# Patient Record
Sex: Female | Born: 1945
Health system: Southern US, Community
[De-identification: ages and names within clinical notes are randomized; demographics above are authoritative.]

## PROBLEM LIST (undated history)

## (undated) DIAGNOSIS — F32A Depression, unspecified: Secondary | ICD-10-CM

## (undated) DIAGNOSIS — F329 Major depressive disorder, single episode, unspecified: Secondary | ICD-10-CM

## (undated) DIAGNOSIS — B019 Varicella without complication: Secondary | ICD-10-CM

## (undated) DIAGNOSIS — T7840XA Allergy, unspecified, initial encounter: Secondary | ICD-10-CM

## (undated) DIAGNOSIS — I10 Essential (primary) hypertension: Secondary | ICD-10-CM

## (undated) DIAGNOSIS — Z780 Asymptomatic menopausal state: Secondary | ICD-10-CM

## (undated) DIAGNOSIS — U071 COVID-19: Secondary | ICD-10-CM

## (undated) DIAGNOSIS — D649 Anemia, unspecified: Secondary | ICD-10-CM

## (undated) HISTORY — DX: Depression, unspecified: F32.A

## (undated) HISTORY — DX: Essential (primary) hypertension: I10

## (undated) HISTORY — DX: Asymptomatic menopausal state: Z78.0

## (undated) HISTORY — DX: Anemia, unspecified: D64.9

## (undated) HISTORY — PX: SKIN BIOPSY: SHX1

## (undated) HISTORY — DX: Major depressive disorder, single episode, unspecified: F32.9

## (undated) HISTORY — DX: Varicella without complication: B01.9

## (undated) HISTORY — PX: TUBAL LIGATION: SHX77

## (undated) HISTORY — DX: COVID-19: U07.1

## (undated) HISTORY — DX: Allergy, unspecified, initial encounter: T78.40XA

---

## 2016-01-01 ENCOUNTER — Ambulatory Visit: Payer: Self-pay | Admitting: Family Medicine

## 2016-01-18 ENCOUNTER — Encounter: Payer: Self-pay | Admitting: Family Medicine

## 2016-01-18 DIAGNOSIS — I10 Essential (primary) hypertension: Secondary | ICD-10-CM | POA: Insufficient documentation

## 2016-01-18 DIAGNOSIS — F329 Major depressive disorder, single episode, unspecified: Secondary | ICD-10-CM | POA: Insufficient documentation

## 2016-01-18 DIAGNOSIS — F32A Depression, unspecified: Secondary | ICD-10-CM | POA: Insufficient documentation

## 2016-01-19 ENCOUNTER — Encounter: Payer: Self-pay | Admitting: Family Medicine

## 2016-01-19 ENCOUNTER — Ambulatory Visit (INDEPENDENT_AMBULATORY_CARE_PROVIDER_SITE_OTHER): Payer: PPO | Admitting: Family Medicine

## 2016-01-19 VITALS — BP 130/70 | HR 89 | Temp 97.8°F | Ht 61.75 in | Wt 205.0 lb

## 2016-01-19 DIAGNOSIS — I10 Essential (primary) hypertension: Secondary | ICD-10-CM | POA: Diagnosis not present

## 2016-01-19 DIAGNOSIS — Z23 Encounter for immunization: Secondary | ICD-10-CM | POA: Diagnosis not present

## 2016-01-19 DIAGNOSIS — Z7189 Other specified counseling: Secondary | ICD-10-CM

## 2016-01-19 DIAGNOSIS — Z0001 Encounter for general adult medical examination with abnormal findings: Secondary | ICD-10-CM | POA: Insufficient documentation

## 2016-01-19 DIAGNOSIS — Z1211 Encounter for screening for malignant neoplasm of colon: Secondary | ICD-10-CM

## 2016-01-19 DIAGNOSIS — Z7689 Persons encountering health services in other specified circumstances: Secondary | ICD-10-CM

## 2016-01-19 DIAGNOSIS — Z0000 Encounter for general adult medical examination without abnormal findings: Secondary | ICD-10-CM | POA: Diagnosis not present

## 2016-01-19 MED ORDER — LISINOPRIL 40 MG PO TABS: 40.0000 mg | ORAL_TABLET | Freq: Every day | ORAL | Status: DC

## 2016-01-19 MED ORDER — AMLODIPINE BESYLATE 10 MG PO TABS: 10.0000 mg | ORAL_TABLET | Freq: Every day | ORAL | Status: DC

## 2016-01-19 NOTE — Patient Instructions (Signed)
It was a pleasure meeting you today.  Please follow instructions for the cologuard.  You have received tdap and prevnar.   Medicare wellness exam in 3-6 mos.

## 2016-01-19 NOTE — Progress Notes (Signed)
Patient ID: Meghan Vang, female   DOB: [birth year], 70 y.o.   MRN: [id number]      Patient ID: Meghan Vang, female  DOB: Oct 12, 1946, 70 y.o.   MRN: [id number]  Subjective:  Meghan Vang is a 70 y.o. female present for establishment of care.  All past medical history, surgical history, allergies, family history, immunizations, medications and social history were obtained and entered in the electronic medical record today. All recent labs, ED visits and hospitalizations within the last year were reviewed.  Hypertension: She reports a history of hypertension, she has been on medications for approximately 2 years. She currently is compliant with her amlodipine 10 mg daily and her lisinopril 40 mg daily she does carry on a active lifestyle, eats a low-salt diet. She denies any chest pain, shortness of breath, dizziness or lower extremity edema. She is needing refills on her medications today.  Health maintenance:  Colonoscopy: Fhx in father. colonoscopy was completed in 2014 and was a three year follow-up per patient. She does not desire colonoscopy, but would Consider cologuard.  Mammogram: No Fhx. Last mammogram. 2015; needs scheduled at [facility] wellness. Cervical cancer screening: not indicated. Immunizations: Unknown.  Infectious disease screening: Not indicated. DEXA: 2015; osteopenia. Told to take calcium and vitamin D use.   Past Medical History  Diagnosis Date  . Depression   . Hypertension   . Menopause   . Allergy   . Anemia   . Chicken pox    Allergies  Allergen Reactions  . Bactrim [Sulfamethoxazole-Trimethoprim]    Past Surgical History  Procedure Laterality Date  . Tubal ligation     Family History  Problem Relation Age of Onset  . Colon cancer Father 2  . Diabetes Father   . Heart disease Father   . Asthma Mother   . Diabetes Mother   . Heart disease Mother   . Asthma Brother   . Heart disease Brother   . Liver cancer Maternal Grandmother   . Heart  disease Maternal Grandfather   . Heart disease Paternal Grandmother   . Stroke Paternal Grandfather    Social History   Social History  . Marital Status: Single    Spouse Name: N/A  . Number of Children: N/A  . Years of Education: N/A   Occupational History  . Not on file.   Social History Main Topics  . Smoking status: Never Smoker   . Smokeless tobacco: Never Used  . Alcohol Use: No  . Drug Use: No  . Sexual Activity: No   Other Topics Concern  . Not on file   Social History Narrative   Divorced. 2 sons, 1 daughter. [name], [name]).   Some college. Retired. Moved from [place] to be next to family.    Drinks caffeine.    Wears her seatbelt and bike helmet.    Smoke detector in the home.    Feels safe in her relationships.         ROS: Negative, with the exception of above mentioned in HPI  Objective: BP 130/70 mmHg  Pulse 89  Temp(Src) 97.8 F (36.6 C) (Oral)  Ht 5' 1.75" (1.568 m)  Wt 205 lb (92.987 kg)  BMI 37.82 kg/m2  SpO2 98% Gen: Afebrile. No acute distress. Nontoxic in appearance, well-developed, well-nourished, female, obese female.  HENT: AT. [place]. Bilateral TM visualized and normal in appearance, normal external auditory canal. MMM, no oral lesions. ilateral nares WNL. Throat WNL  Eyes:Pupils Equal Round Reactive to light, Extraocular movements intact,  Conjunctiva without redness, discharge or icterus. Neck/lymp/endocrine: Supple,No lymphadenopathy, No thyromegaly CV: RRR, No edema, +2/4 P posterior tibialis pulses.  Chest: CTAB, no wheeze, rhonchi or crackles.  Abd: Soft. NTND. BS present  Skin: No rashes, purpura or petechiae. Warm and well-perfused. Skin intact. Neuro/Msk: Normal gait. PERLA. EOMi. Alert. Oriented x3.   Psych: Normal affect, dress and demeanor. Normal speech. Normal thought content and judgment.   Assessment/plan: Meghan Vang is a 70 y.o. female present for establish care.  Colonoscopy: Fhx in father. colonoscopy was  completed in 2014 and was a three year follow-up per patient. She does not desire colonoscopy, but would Consider cologuard.  Mammogram: No Fhx. Last mammogram. 2015; needs scheduled at [facility] wellness. Cervical cancer screening: not indicated. Immunizations: Unknown. Tdap and prevnar administered today  Infectious disease screening: Not indicated. DEXA: 2015; osteopenia. Told to take calcium and vitamin D use.  - Cologuard information given to patient.--> colon cancer screening.  - Tdap vaccine greater than or equal to 7yo IM - Pneumococcal conjugate vaccine 13-valent  Essential hypertension, benign - Continue amlodipine and lisinopril, refills prescribed today. - Follow-up in 6 months. Low-salt diet encouraged. Routine exercise.   Return in about 6 months (around 07/21/2016), or MWV, subsequent, for Office visit. Greater than 45 minutes was spent with patient, greater than 50% of that time was spent face-to-face with patient counseling and coordinating care.  Electronically signed by: Howard Pouch, DO [place]

## 2016-04-14 DIAGNOSIS — Z1211 Encounter for screening for malignant neoplasm of colon: Secondary | ICD-10-CM | POA: Diagnosis not present

## 2016-04-14 DIAGNOSIS — Z1212 Encounter for screening for malignant neoplasm of rectum: Secondary | ICD-10-CM | POA: Diagnosis not present

## 2016-04-29 LAB — COLOGUARD: Cologuard: NEGATIVE

## 2016-07-20 ENCOUNTER — Encounter: Payer: PPO | Admitting: Family Medicine

## 2016-08-02 ENCOUNTER — Other Ambulatory Visit: Payer: Self-pay | Admitting: Family Medicine

## 2016-08-02 MED ORDER — AMLODIPINE BESYLATE 10 MG PO TABS: 10.0000 mg | ORAL_TABLET | Freq: Every day | ORAL | 0 refills | Status: DC

## 2016-08-02 MED ORDER — LISINOPRIL 40 MG PO TABS: 40.0000 mg | ORAL_TABLET | Freq: Every day | ORAL | 0 refills | Status: DC

## 2016-08-02 NOTE — Telephone Encounter (Signed)
Patient had to push her CPE back to 08/12/16 due to a family emergency.   Pt wanting to get an Rx for amlodipine and lisinopril just enough to get her to her appointment.  She will run out tomorrow.  Please call patient when filled.

## 2016-08-04 ENCOUNTER — Encounter: Payer: PPO | Admitting: Family Medicine

## 2016-08-12 ENCOUNTER — Encounter: Payer: PPO | Admitting: Family Medicine

## 2016-08-16 ENCOUNTER — Encounter: Payer: Self-pay | Admitting: Family Medicine

## 2016-08-16 ENCOUNTER — Ambulatory Visit (INDEPENDENT_AMBULATORY_CARE_PROVIDER_SITE_OTHER): Payer: PPO | Admitting: Family Medicine

## 2016-08-16 VITALS — BP 121/80 | HR 94 | Temp 98.3°F | Resp 20 | Ht 62.0 in | Wt 204.0 lb

## 2016-08-16 DIAGNOSIS — Z6837 Body mass index (BMI) 37.0-37.9, adult: Secondary | ICD-10-CM

## 2016-08-16 DIAGNOSIS — Z0000 Encounter for general adult medical examination without abnormal findings: Secondary | ICD-10-CM

## 2016-08-16 DIAGNOSIS — Z131 Encounter for screening for diabetes mellitus: Secondary | ICD-10-CM | POA: Diagnosis not present

## 2016-08-16 DIAGNOSIS — I10 Essential (primary) hypertension: Secondary | ICD-10-CM

## 2016-08-16 DIAGNOSIS — Z13220 Encounter for screening for lipoid disorders: Secondary | ICD-10-CM | POA: Diagnosis not present

## 2016-08-16 DIAGNOSIS — Z130 Encounter for screening for diseases of the blood and blood-forming organs and certain disorders involving the immune mechanism: Secondary | ICD-10-CM

## 2016-08-16 DIAGNOSIS — Z1329 Encounter for screening for other suspected endocrine disorder: Secondary | ICD-10-CM

## 2016-08-16 DIAGNOSIS — Z1159 Encounter for screening for other viral diseases: Secondary | ICD-10-CM

## 2016-08-16 LAB — COMPREHENSIVE METABOLIC PANEL
ALK PHOS: 107 U/L (ref 39–117)
ALT: 11 U/L (ref 0–35)
AST: 15 U/L (ref 0–37)
Albumin: 4.2 g/dL (ref 3.5–5.2)
BILIRUBIN TOTAL: 0.5 mg/dL (ref 0.2–1.2)
BUN: 11 mg/dL (ref 6–23)
CALCIUM: 9.2 mg/dL (ref 8.4–10.5)
CO2: 28 meq/L (ref 19–32)
Chloride: 103 mEq/L (ref 96–112)
Creatinine, Ser: 0.82 mg/dL (ref 0.40–1.20)
GFR: 73.18 mL/min (ref >60.00)
GLUCOSE: 104 mg/dL — AB (ref 70–99)
POTASSIUM: 4.4 meq/L (ref 3.5–5.1)
Sodium: 138 mEq/L (ref 135–145)
Total Protein: 7.4 g/dL (ref 6.0–8.3)

## 2016-08-16 LAB — CBC WITH DIFFERENTIAL/PLATELET
BASOS ABS: 0 10*3/uL (ref 0.0–0.1)
BASOS PCT: 0.5 % (ref 0.0–3.0)
EOS PCT: 1.2 % (ref 0.0–5.0)
Eosinophils Absolute: 0.1 10*3/uL (ref 0.0–0.7)
HEMATOCRIT: 41.7 % (ref 36.0–46.0)
Hemoglobin: 14.1 g/dL (ref 12.0–15.0)
LYMPHS ABS: 2.6 10*3/uL (ref 0.7–4.0)
LYMPHS PCT: 28.2 % (ref 12.0–46.0)
MCHC: 33.9 g/dL (ref 30.0–36.0)
MCV: 89 fl (ref 78.0–100.0)
MONOS PCT: 6 % (ref 3.0–12.0)
Monocytes Absolute: 0.5 10*3/uL (ref 0.1–1.0)
NEUTROS ABS: 5.9 10*3/uL (ref 1.4–7.7)
NEUTROS PCT: 64.1 % (ref 43.0–77.0)
PLATELETS: 291 10*3/uL (ref 150.0–400.0)
RBC: 4.69 Mil/uL (ref 3.87–5.11)
RDW: 14.4 % (ref 11.5–15.5)
WBC: 9.1 10*3/uL (ref 4.0–10.5)

## 2016-08-16 LAB — HEMOGLOBIN A1C: HEMOGLOBIN A1C: 5.8 % (ref 4.6–6.5)

## 2016-08-16 LAB — LIPID PANEL
CHOL/HDL RATIO: 3
CHOLESTEROL: 167 mg/dL (ref 0–200)
HDL: 59.7 mg/dL (ref >39.00)
LDL Cholesterol: 76 mg/dL (ref 0–99)
NonHDL: 106.93
TRIGLYCERIDES: 153 mg/dL — AB (ref 0.0–149.0)
VLDL: 30.6 mg/dL (ref 0.0–40.0)

## 2016-08-16 LAB — TSH: TSH: 1.58 u[IU]/mL (ref 0.35–4.50)

## 2016-08-16 MED ORDER — AMLODIPINE BESYLATE 10 MG PO TABS: 10.0000 mg | ORAL_TABLET | Freq: Every day | ORAL | 1 refills | Status: DC

## 2016-08-16 MED ORDER — ZOSTER VACCINE LIVE 19400 UNT/0.65ML ~~LOC~~ SUSR: 0.6500 mL | Freq: Once | SUBCUTANEOUS | 0 refills | Status: AC

## 2016-08-16 MED ORDER — LISINOPRIL 40 MG PO TABS: 40.0000 mg | ORAL_TABLET | Freq: Every day | ORAL | 1 refills | Status: DC

## 2016-08-16 NOTE — Progress Notes (Signed)
Patient ID: Meghan Vang, female   DOB: 09-11-1946, 70 y.o.   MRN: [id number]      Patient ID: Meghan Vang, female  DOB: 1946/11/08, 70 y.o.   MRN: [id number]  Subjective:  Meghan Vang is a 70 y.o. female present for CPE All past medical history, surgical history, allergies, family history, immunizations, medications and social history were obtained and entered in the electronic medical record today. All recent labs, ED visits and hospitalizations within the last year were reviewed. Patient Care Team    Relationship Specialty Notifications Start End  Ma Hillock, DO PCP - General Family Medicine  01/19/16    Pt has no complaints today.   Health maintenance:  Colonoscopy: Fhx in father. colonoscopy was completed in 2014 and was a three year follow-up per patient. She does not desire colonoscopy. Cologuard completed  04/14/2016: negative.  Mammogram: No Fhx. Last mammogram. 2015, birads 1; declines mammogram today Cervical cancer screening: not indicated. Immunizations: declines flu shot today. tdap 01/19/2016. PNA 13 01/2016. Zostavax offered and printed today. Infectious disease screening: HEP C indicated and pt agreeable to screening.  DEXA: 2015; osteopenia. Told to take calcium and vitamin D (1000u)   Depression screen [facility] 2/9 08/16/2016  Decreased Interest 0  Down, Depressed, Hopeless 0  PHQ - 2 Score 0   Current Exercise Habits: The patient does not participate in regular exercise at present Exercise limited by: Other - see comments (bunions)   Past Medical History:  Diagnosis Date  . Allergy   . Anemia   . Chicken pox   . Depression   . Hypertension   . Menopause    Allergies  Allergen Reactions  . Bactrim [Sulfamethoxazole-Trimethoprim]    Past Surgical History:  Procedure Laterality Date  . TUBAL LIGATION     Family History  Problem Relation Age of Onset  . Colon cancer Father 24  . Diabetes Father   . Heart disease Father   . Asthma Mother   . Diabetes  Mother   . Heart disease Mother   . Asthma Brother   . Heart disease Brother   . Liver cancer Maternal Grandmother   . Heart disease Maternal Grandfather   . Heart disease Paternal Grandmother   . Stroke Paternal Grandfather    Social History   Social History  . Marital status: Single    Spouse name: N/A  . Number of children: N/A  . Years of education: N/A   Occupational History  . Not on file.   Social History Main Topics  . Smoking status: Never Smoker  . Smokeless tobacco: Never Used  . Alcohol use No  . Drug use: No  . Sexual activity: No   Other Topics Concern  . Not on file   Social History Narrative   Divorced. 2 sons, 1 daughter. Meghan Vang, Meghan Vang).   Some college. Retired. Moved from [place] to be next to family.    Drinks caffeine.    Wears her seatbelt and bike helmet.    Smoke detector in the home.    Feels safe in her relationships.         ROS: Negative, with the exception of above mentioned in HPI  Objective: BP 121/80 (BP Location: Right Arm, Patient Position: Sitting, Cuff Size: Large)   Pulse 94   Temp 98.3 F (36.8 C)   Resp 20   Ht 5' 2"  (1.575 m)   Wt 204 lb (92.5 kg)   SpO2 97%   BMI 37.31 kg/m  Gen: Afebrile. No acute distress. Nontoxic in appearance, well-developed, well-nourished, female, obese. Very pleasant.  HENT: AT. [place]. Bilateral TM visualized and normal in appearance, normal external auditory canal with the exception of very mild dryness distal posterior canal. MMM, no oral lesions. bilateral nares WNL. Throat WNL. No cough or hoarseness on exam.  Eyes:Pupils Equal Round Reactive to light, Extraocular movements intact,  Conjunctiva without redness, discharge or icterus. Neck/lymp/endocrine: Supple,No lymphadenopathy, No thyromegaly CV: RRR, no murmur, No edema, +2/4 P posterior tibialis pulses.  Chest: CTAB, no wheeze, rhonchi or crackles.  Abd: Soft. NTND. BS present  Skin: No rashes, purpura or petechiae. Warm and  well-perfused. Skin intact. Neuro/Msk: Normal gait. PERLA. EOMi. Alert. Oriented x3. MS 5/5 UE/LE.  Psych: Normal affect, dress and demeanor. Normal speech. Normal thought content and judgment.  Assessment/plan: Meghan Vang is a 70 y.o. female present for CPE. Encounter for preventive health examination Pt encouraged to exercise > 150 minutes a week. Weight bearing exercise good for her bone health.  AVS provided on health maintenance.  Colonoscopy: Fhx in father. colonoscopy was completed in 2014 and was a three year follow-up per patient. She does not desire colonoscopy. Cologuard completed  04/14/2016: negative.  Mammogram: No Fhx. Last mammogram. 2015, birads 1; declines mammogram today Cervical cancer screening: not indicated. Immunizations: declines flu shot today. tdap 01/19/2016. PNA 13 01/2016. Zostavax offered and printed today. Infectious disease screening: HEP C indicated and pt agreeable to screening.  DEXA: 2015; osteopenia. Told to take calcium and vitamin D (1000u)  Screening for deficiency anemia - CBC w/Diff Thyroid disorder screen - TSH Diabetes mellitus screening - HgB A1c Screening cholesterol level - Lipid panel BMI 37.0-37.9, adult - TSH - HgB A1c - Lipid panel  Need for hepatitis C screening test - Hepatitis C Antibody Essential hypertension, benign - stable. Refills provided for 6 months - CBC w/Diff - lisinopril (PRINIVIL,ZESTRIL) 40 MG tablet; Take 1 tablet (40 mg total) by mouth daily.  Dispense: 90 tablet; Refill: 1 - amLODipine (NORVASC) 10 MG tablet; Take 1 tablet (10 mg total) by mouth daily.  Dispense: 90 tablet; Refill: 1 - Comp Met (CMET) - f/u  6 months.    Return in about 6 months (around 02/14/2017), or chronic medical conditions, for Chronic medical conditions. .   Electronically signed by: Howard Pouch, DO [place]

## 2016-08-16 NOTE — Patient Instructions (Signed)
Try to take about 1000u of vit D a day with food.   Health Maintenance, Female Adopting a healthy lifestyle and getting preventive care can go a long way to promote health and wellness. Talk with your health care provider about what schedule of regular examinations is right for you. This is a good chance for you to check in with your provider about disease prevention and staying healthy. In between checkups, there are plenty of things you can do on your own. Experts have done a lot of research about which lifestyle changes and preventive measures are most likely to keep you healthy. Ask your health care provider for more information. WEIGHT AND DIET  Eat a healthy diet  Be sure to include plenty of vegetables, fruits, low-fat dairy products, and lean protein.  Do not eat a lot of foods high in solid fats, added sugars, or salt.  Get regular exercise. This is one of the most important things you can do for your health.  Most adults should exercise for at least 150 minutes each week. The exercise should increase your heart rate and make you sweat (moderate-intensity exercise).  Most adults should also do strengthening exercises at least twice a week. This is in addition to the moderate-intensity exercise.  Maintain a healthy weight  Body mass index (BMI) is a measurement that can be used to identify possible weight problems. It estimates body fat based on height and weight. Your health care provider can help determine your BMI and help you achieve or maintain a healthy weight.  For females 46 years of age and older:   A BMI below 18.5 is considered underweight.  A BMI of 18.5 to 24.9 is normal.  A BMI of 25 to 29.9 is considered overweight.  A BMI of 30 and above is considered obese.  Watch levels of cholesterol and blood lipids  You should start having your blood tested for lipids and cholesterol at 70 years of age, then have this test every 5 years.  You may need to have your  cholesterol levels checked more often if:  Your lipid or cholesterol levels are high.  You are older than 70 years of age.  You are at high risk for heart disease.  CANCER SCREENING   Lung Cancer  Lung cancer screening is recommended for adults 58-57 years old who are at high risk for lung cancer because of a history of smoking.  A yearly low-dose CT scan of the lungs is recommended for people who:  Currently smoke.  Have quit within the past 15 years.  Have at least a 30-pack-year history of smoking. A pack year is smoking an average of one pack of cigarettes a day for 1 year.  Yearly screening should continue until it has been 15 years since you quit.  Yearly screening should stop if you develop a health problem that would prevent you from having lung cancer treatment.  Breast Cancer  Practice breast self-awareness. This means understanding how your breasts normally appear and feel.  It also means doing regular breast self-exams. Let your health care provider know about any changes, no matter how small.  If you are in your 20s or 30s, you should have a clinical breast exam (CBE) by a health care provider every 1-3 years as part of a regular health exam.  If you are 73 or older, have a CBE every year. Also consider having a breast X-ray (mammogram) every year.  If you have a family history of  breast cancer, talk to your health care provider about genetic screening.  If you are at high risk for breast cancer, talk to your health care provider about having an MRI and a mammogram every year.  Breast cancer gene (BRCA) assessment is recommended for women who have family members with BRCA-related cancers. BRCA-related cancers include:  Breast.  Ovarian.  Tubal.  Peritoneal cancers.  Results of the assessment will determine the need for genetic counseling and BRCA1 and BRCA2 testing. Cervical Cancer Your health care provider may recommend that you be screened regularly  for cancer of the pelvic organs (ovaries, uterus, and vagina). This screening involves a pelvic examination, including checking for microscopic changes to the surface of your cervix (Pap test). You may be encouraged to have this screening done every 3 years, beginning at age 34.  For women ages 72-65, health care providers may recommend pelvic exams and Pap testing every 3 years, or they may recommend the Pap and pelvic exam, combined with testing for human papilloma virus (HPV), every 5 years. Some types of HPV increase your risk of cervical cancer. Testing for HPV may also be done on women of any age with unclear Pap test results.  Other health care providers may not recommend any screening for nonpregnant women who are considered low risk for pelvic cancer and who do not have symptoms. Ask your health care provider if a screening pelvic exam is right for you.  If you have had past treatment for cervical cancer or a condition that could lead to cancer, you need Pap tests and screening for cancer for at least 20 years after your treatment. If Pap tests have been discontinued, your risk factors (such as having a new sexual partner) need to be reassessed to determine if screening should resume. Some women have medical problems that increase the chance of getting cervical cancer. In these cases, your health care provider may recommend more frequent screening and Pap tests. Colorectal Cancer  This type of cancer can be detected and often prevented.  Routine colorectal cancer screening usually begins at 70 years of age and continues through 70 years of age.  Your health care provider may recommend screening at an earlier age if you have risk factors for colon cancer.  Your health care provider may also recommend using home test kits to check for hidden blood in the stool.  A small camera at the end of a tube can be used to examine your colon directly (sigmoidoscopy or colonoscopy). This is done to check  for the earliest forms of colorectal cancer.  Routine screening usually begins at age 15.  Direct examination of the colon should be repeated every 5-10 years through 70 years of age. However, you may need to be screened more often if early forms of precancerous polyps or small growths are found. Skin Cancer  Check your skin from head to toe regularly.  Tell your health care provider about any new moles or changes in moles, especially if there is a change in a mole's shape or color.  Also tell your health care provider if you have a mole that is larger than the size of a pencil eraser.  Always use sunscreen. Apply sunscreen liberally and repeatedly throughout the day.  Protect yourself by wearing long sleeves, pants, a wide-brimmed hat, and sunglasses whenever you are outside. HEART DISEASE, DIABETES, AND HIGH BLOOD PRESSURE   High blood pressure causes heart disease and increases the risk of stroke. High blood pressure is more  likely to develop in:  People who have blood pressure in the high end of the normal range (130-139/85-89 mm Hg).  People who are overweight or obese.  People who are African American.  If you are 29-53 years of age, have your blood pressure checked every 3-5 years. If you are 33 years of age or older, have your blood pressure checked every year. You should have your blood pressure measured twice--once when you are at a hospital or clinic, and once when you are not at a hospital or clinic. Record the average of the two measurements. To check your blood pressure when you are not at a hospital or clinic, you can use:  An automated blood pressure machine at a pharmacy.  A home blood pressure monitor.  If you are between 12 years and 1 years old, ask your health care provider if you should take aspirin to prevent strokes.  Have regular diabetes screenings. This involves taking a blood sample to check your fasting blood sugar level.  If you are at a normal  weight and have a low risk for diabetes, have this test once every three years after 70 years of age.  If you are overweight and have a high risk for diabetes, consider being tested at a younger age or more often. PREVENTING INFECTION  Hepatitis B  If you have a higher risk for hepatitis B, you should be screened for this virus. You are considered at high risk for hepatitis B if:  You were born in a country where hepatitis B is common. Ask your health care provider which countries are considered high risk.  Your parents were born in a high-risk country, and you have not been immunized against hepatitis B (hepatitis B vaccine).  You have HIV or AIDS.  You use needles to inject street drugs.  You live with someone who has hepatitis B.  You have had sex with someone who has hepatitis B.  You get hemodialysis treatment.  You take certain medicines for conditions, including cancer, organ transplantation, and autoimmune conditions. Hepatitis C  Blood testing is recommended for:  Everyone born from [birth year] through 1965.  Anyone with known risk factors for hepatitis C. Sexually transmitted infections (STIs)  You should be screened for sexually transmitted infections (STIs) including gonorrhea and chlamydia if:  You are sexually active and are younger than 70 years of age.  You are older than 70 years of age and your health care provider tells you that you are at risk for this type of infection.  Your sexual activity has changed since you were last screened and you are at an increased risk for chlamydia or gonorrhea. Ask your health care provider if you are at risk.  If you do not have HIV, but are at risk, it may be recommended that you take a prescription medicine daily to prevent HIV infection. This is called pre-exposure prophylaxis (PrEP). You are considered at risk if:  You are sexually active and do not regularly use condoms or know the HIV status of your partner(s).  You take  drugs by injection.  You are sexually active with a partner who has HIV. Talk with your health care provider about whether you are at high risk of being infected with HIV. If you choose to begin PrEP, you should first be tested for HIV. You should then be tested every 3 months for as long as you are taking PrEP.  PREGNANCY   If you are premenopausal and you may  become pregnant, ask your health care provider about preconception counseling.  If you may become pregnant, take 400 to 800 micrograms (mcg) of folic acid every day.  If you want to prevent pregnancy, talk to your health care provider about birth control (contraception). OSTEOPOROSIS AND MENOPAUSE   Osteoporosis is a disease in which the bones lose minerals and strength with aging. This can result in serious bone fractures. Your risk for osteoporosis can be identified using a bone density scan.  If you are 3 years of age or older, or if you are at risk for osteoporosis and fractures, ask your health care provider if you should be screened.  Ask your health care provider whether you should take a calcium or vitamin D supplement to lower your risk for osteoporosis.  Menopause may have certain physical symptoms and risks.  Hormone replacement therapy may reduce some of these symptoms and risks. Talk to your health care provider about whether hormone replacement therapy is right for you.  HOME CARE INSTRUCTIONS   Schedule regular health, dental, and eye exams.  Stay current with your immunizations.   Do not use any tobacco products including cigarettes, chewing tobacco, or electronic cigarettes.  If you are pregnant, do not drink alcohol.  If you are breastfeeding, limit how much and how often you drink alcohol.  Limit alcohol intake to no more than 1 drink per day for nonpregnant women. One drink equals 12 ounces of beer, 5 ounces of wine, or 1 ounces of hard liquor.  Do not use street drugs.  Do not share  needles.  Ask your health care provider for help if you need support or information about quitting drugs.  Tell your health care provider if you often feel depressed.  Tell your health care provider if you have ever been abused or do not feel safe at home.   This information is not intended to replace advice given to you by your health care provider. Make sure you discuss any questions you have with your health care provider.   Document Released: 05/16/2011 Document Revised: 11/21/2014 Document Reviewed: 10/02/2013 Elsevier Interactive Patient Education [employer].

## 2016-08-17 ENCOUNTER — Telehealth: Payer: Self-pay | Admitting: Family Medicine

## 2016-08-17 DIAGNOSIS — R7303 Prediabetes: Secondary | ICD-10-CM | POA: Insufficient documentation

## 2016-08-17 LAB — HEPATITIS C ANTIBODY: HCV Ab: NEGATIVE

## 2016-08-17 NOTE — Telephone Encounter (Signed)
Left detailed message with lab results and instructions on patient voice mail per DPR. 

## 2016-08-17 NOTE — Telephone Encounter (Signed)
Please call pt; - her labs all look good, with the exception of very mild elevation in triglycerides (part of cholesterol panel) and elevated a1c, which puts her in the "prediabetic range".  - To help lower triglycerides and a1c: increase exercise if able to > 150 minutes a week. Cut back and sugar/carbs, eat more fish like albacore tuna and salmon, which are high in omega-3s (and/or add fish oil to regimen), a fat that's good for you. - F/U in 6 months on chronic medical conditions and we will also repeat a1c at that time.

## 2016-11-15 ENCOUNTER — Other Ambulatory Visit: Payer: Self-pay | Admitting: Family Medicine

## 2016-11-15 DIAGNOSIS — I10 Essential (primary) hypertension: Secondary | ICD-10-CM

## 2016-12-27 ENCOUNTER — Telehealth: Payer: Self-pay | Admitting: Family Medicine

## 2016-12-27 NOTE — Telephone Encounter (Signed)
Called patient to schedule awv. Lvm for patient to call office to schedule appt.  °

## 2017-02-06 ENCOUNTER — Ambulatory Visit: Payer: PPO | Admitting: Family Medicine

## 2017-02-13 ENCOUNTER — Encounter: Payer: Self-pay | Admitting: *Deleted

## 2017-02-13 ENCOUNTER — Telehealth: Payer: Self-pay | Admitting: Family Medicine

## 2017-02-13 DIAGNOSIS — I10 Essential (primary) hypertension: Secondary | ICD-10-CM

## 2017-02-13 MED ORDER — LISINOPRIL 40 MG PO TABS: ORAL_TABLET | ORAL | 0 refills | Status: DC

## 2017-02-13 MED ORDER — AMLODIPINE BESYLATE 10 MG PO TABS: ORAL_TABLET | ORAL | 0 refills | Status: DC

## 2017-02-13 NOTE — Telephone Encounter (Signed)
**  Remind patient they can make refill requests via MyChart**  Medication refill request (Name & Dosage):  amLODipine (NORVASC) 10 MG tablet lisinopril (PRINIVIL,ZESTRIL) 40 MG tablet  Preferred pharmacy (Name & Address):  [facility], [place]   Other comments (if applicable):   Patient nees it by 02/27/2023. Had a death in family and going out of town.

## 2017-02-15 ENCOUNTER — Ambulatory Visit: Payer: PPO | Admitting: Family Medicine

## 2017-03-03 ENCOUNTER — Telehealth: Payer: Self-pay

## 2017-03-03 NOTE — Telephone Encounter (Signed)
LM requesting CB regarding AWV. Requesting pt to complete AWV with Health Coach on 03/17/2017 @ 08:30 after appointment with PCP.

## 2017-03-17 ENCOUNTER — Encounter: Payer: Self-pay | Admitting: Family Medicine

## 2017-03-17 ENCOUNTER — Ambulatory Visit (INDEPENDENT_AMBULATORY_CARE_PROVIDER_SITE_OTHER): Payer: PPO | Admitting: Family Medicine

## 2017-03-17 VITALS — BP 127/79 | HR 71 | Temp 97.9°F | Resp 20 | Ht 62.0 in | Wt 203.0 lb

## 2017-03-17 DIAGNOSIS — E669 Obesity, unspecified: Secondary | ICD-10-CM | POA: Insufficient documentation

## 2017-03-17 DIAGNOSIS — Z6837 Body mass index (BMI) 37.0-37.9, adult: Secondary | ICD-10-CM

## 2017-03-17 DIAGNOSIS — Z0000 Encounter for general adult medical examination without abnormal findings: Secondary | ICD-10-CM

## 2017-03-17 DIAGNOSIS — I10 Essential (primary) hypertension: Secondary | ICD-10-CM

## 2017-03-17 DIAGNOSIS — E2839 Other primary ovarian failure: Secondary | ICD-10-CM | POA: Diagnosis not present

## 2017-03-17 DIAGNOSIS — R7303 Prediabetes: Secondary | ICD-10-CM

## 2017-03-17 LAB — POCT GLYCOSYLATED HEMOGLOBIN (HGB A1C): Hemoglobin A1C: 5.4

## 2017-03-17 MED ORDER — AMLODIPINE BESYLATE 10 MG PO TABS: ORAL_TABLET | ORAL | 1 refills | Status: DC

## 2017-03-17 MED ORDER — LISINOPRIL 40 MG PO TABS: ORAL_TABLET | ORAL | 1 refills | Status: DC

## 2017-03-17 NOTE — Patient Instructions (Addendum)
Your BP looks great and her a1c id now normal! [place] job! Try the PREP program to help kick start your exercise and diet regimen.  Low sodium diet.  See you in 6 months for your Physical.   Bring a copy of your advance directives to your next office visit.  Continue doing brain stimulating activities (puzzles, reading, adult coloring books, staying active) to keep memory sharp.   Protein Shakes: Syntha 6 Edge or Muscle Milk Light  Schedule bone scan.   Check with insurance/pharmacy for Shingrix coverage.     Health Maintenance, Female Adopting a healthy lifestyle and getting preventive care can go a long way to promote health and wellness. Talk with your health care provider about what schedule of regular examinations is right for you. This is a good chance for you to check in with your provider about disease prevention and staying healthy. In between checkups, there are plenty of things you can do on your own. Experts have done a lot of research about which lifestyle changes and preventive measures are most likely to keep you healthy. Ask your health care provider for more information. Weight and diet Eat a healthy diet  Be sure to include plenty of vegetables, fruits, low-fat dairy products, and lean protein.  Do not eat a lot of foods high in solid fats, added sugars, or salt.  Get regular exercise. This is one of the most important things you can do for your health.  Most adults should exercise for at least 150 minutes each week. The exercise should increase your heart rate and make you sweat (moderate-intensity exercise).  Most adults should also do strengthening exercises at least twice a week. This is in addition to the moderate-intensity exercise. Maintain a healthy weight  Body mass index (BMI) is a measurement that can be used to identify possible weight problems. It estimates body fat based on height and weight. Your health care provider can help determine your BMI and  help you achieve or maintain a healthy weight.  For females 69 years of age and older:  A BMI below 18.5 is considered underweight.  A BMI of 18.5 to 24.9 is normal.  A BMI of 25 to 29.9 is considered overweight.  A BMI of 30 and above is considered obese. Watch levels of cholesterol and blood lipids  You should start having your blood tested for lipids and cholesterol at 71 years of age, then have this test every 5 years.  You may need to have your cholesterol levels checked more often if:  Your lipid or cholesterol levels are high.  You are older than 71 years of age.  You are at high risk for heart disease. Cancer screening Lung Cancer  Lung cancer screening is recommended for adults 62-80 years old who are at high risk for lung cancer because of a history of smoking.  A yearly low-dose CT scan of the lungs is recommended for people who:  Currently smoke.  Have quit within the past 15 years.  Have at least a 30-pack-year history of smoking. A pack year is smoking an average of one pack of cigarettes a day for 1 year.  Yearly screening should continue until it has been 15 years since you quit.  Yearly screening should stop if you develop a health problem that would prevent you from having lung cancer treatment. Breast Cancer  Practice breast self-awareness. This means understanding how your breasts normally appear and feel.  It also means doing regular breast self-exams.  Let your health care provider know about any changes, no matter how small.  If you are in your 20s or 30s, you should have a clinical breast exam (CBE) by a health care provider every 1-3 years as part of a regular health exam.  If you are 99 or older, have a CBE every year. Also consider having a breast X-ray (mammogram) every year.  If you have a family history of breast cancer, talk to your health care provider about genetic screening.  If you are at high risk for breast cancer, talk to your  health care provider about having an MRI and a mammogram every year.  Breast cancer gene (BRCA) assessment is recommended for women who have family members with BRCA-related cancers. BRCA-related cancers include:  Breast.  Ovarian.  Tubal.  Peritoneal cancers.  Results of the assessment will determine the need for genetic counseling and BRCA1 and BRCA2 testing. Cervical Cancer  Your health care provider may recommend that you be screened regularly for cancer of the pelvic organs (ovaries, uterus, and vagina). This screening involves a pelvic examination, including checking for microscopic changes to the surface of your cervix (Pap test). You may be encouraged to have this screening done every 3 years, beginning at age 69.  For women ages 67-65, health care providers may recommend pelvic exams and Pap testing every 3 years, or they may recommend the Pap and pelvic exam, combined with testing for human papilloma virus (HPV), every 5 years. Some types of HPV increase your risk of cervical cancer. Testing for HPV may also be done on women of any age with unclear Pap test results.  Other health care providers may not recommend any screening for nonpregnant women who are considered low risk for pelvic cancer and who do not have symptoms. Ask your health care provider if a screening pelvic exam is right for you.  If you have had past treatment for cervical cancer or a condition that could lead to cancer, you need Pap tests and screening for cancer for at least 20 years after your treatment. If Pap tests have been discontinued, your risk factors (such as having a new sexual partner) need to be reassessed to determine if screening should resume. Some women have medical problems that increase the chance of getting cervical cancer. In these cases, your health care provider may recommend more frequent screening and Pap tests. Colorectal Cancer  This type of cancer can be detected and often  prevented.  Routine colorectal cancer screening usually begins at 71 years of age and continues through 71 years of age.  Your health care provider may recommend screening at an earlier age if you have risk factors for colon cancer.  Your health care provider may also recommend using home test kits to check for hidden blood in the stool.  A small camera at the end of a tube can be used to examine your colon directly (sigmoidoscopy or colonoscopy). This is done to check for the earliest forms of colorectal cancer.  Routine screening usually begins at age 80.  Direct examination of the colon should be repeated every 5-10 years through 71 years of age. However, you may need to be screened more often if early forms of precancerous polyps or small growths are found. Skin Cancer  Check your skin from head to toe regularly.  Tell your health care provider about any new moles or changes in moles, especially if there is a change in a mole's shape or color.  Also  tell your health care provider if you have a mole that is larger than the size of a pencil eraser.  Always use sunscreen. Apply sunscreen liberally and repeatedly throughout the day.  Protect yourself by wearing long sleeves, pants, a wide-brimmed hat, and sunglasses whenever you are outside. Heart disease, diabetes, and high blood pressure  High blood pressure causes heart disease and increases the risk of stroke. High blood pressure is more likely to develop in:  People who have blood pressure in the high end of the normal range (130-139/85-89 mm Hg).  People who are overweight or obese.  People who are African American.  If you are 43-53 years of age, have your blood pressure checked every 3-5 years. If you are 49 years of age or older, have your blood pressure checked every year. You should have your blood pressure measured twice-once when you are at a hospital or clinic, and once when you are not at a hospital or clinic. Record  the average of the two measurements. To check your blood pressure when you are not at a hospital or clinic, you can use:  An automated blood pressure machine at a pharmacy.  A home blood pressure monitor.  If you are between 14 years and 61 years old, ask your health care provider if you should take aspirin to prevent strokes.  Have regular diabetes screenings. This involves taking a blood sample to check your fasting blood sugar level.  If you are at a normal weight and have a low risk for diabetes, have this test once every three years after 71 years of age.  If you are overweight and have a high risk for diabetes, consider being tested at a younger age or more often. Preventing infection Hepatitis B  If you have a higher risk for hepatitis B, you should be screened for this virus. You are considered at high risk for hepatitis B if:  You were born in a country where hepatitis B is common. Ask your health care provider which countries are considered high risk.  Your parents were born in a high-risk country, and you have not been immunized against hepatitis B (hepatitis B vaccine).  You have HIV or AIDS.  You use needles to inject street drugs.  You live with someone who has hepatitis B.  You have had sex with someone who has hepatitis B.  You get hemodialysis treatment.  You take certain medicines for conditions, including cancer, organ transplantation, and autoimmune conditions. Hepatitis C  Blood testing is recommended for:  Everyone born from [birth year] through 1965.  Anyone with known risk factors for hepatitis C. Sexually transmitted infections (STIs)  You should be screened for sexually transmitted infections (STIs) including gonorrhea and chlamydia if:  You are sexually active and are younger than 71 years of age.  You are older than 71 years of age and your health care provider tells you that you are at risk for this type of infection.  Your sexual activity has  changed since you were last screened and you are at an increased risk for chlamydia or gonorrhea. Ask your health care provider if you are at risk.  If you do not have HIV, but are at risk, it may be recommended that you take a prescription medicine daily to prevent HIV infection. This is called pre-exposure prophylaxis (PrEP). You are considered at risk if:  You are sexually active and do not regularly use condoms or know the HIV status of your partner(s).  You  take drugs by injection.  You are sexually active with a partner who has HIV. Talk with your health care provider about whether you are at high risk of being infected with HIV. If you choose to begin PrEP, you should first be tested for HIV. You should then be tested every 3 months for as long as you are taking PrEP. Pregnancy  If you are premenopausal and you may become pregnant, ask your health care provider about preconception counseling.  If you may become pregnant, take 400 to 800 micrograms (mcg) of folic acid every day.  If you want to prevent pregnancy, talk to your health care provider about birth control (contraception). Osteoporosis and menopause  Osteoporosis is a disease in which the bones lose minerals and strength with aging. This can result in serious bone fractures. Your risk for osteoporosis can be identified using a bone density scan.  If you are 27 years of age or older, or if you are at risk for osteoporosis and fractures, ask your health care provider if you should be screened.  Ask your health care provider whether you should take a calcium or vitamin D supplement to lower your risk for osteoporosis.  Menopause may have certain physical symptoms and risks.  Hormone replacement therapy may reduce some of these symptoms and risks. Talk to your health care provider about whether hormone replacement therapy is right for you. Follow these instructions at home:  Schedule regular health, dental, and eye  exams.  Stay current with your immunizations.  Do not use any tobacco products including cigarettes, chewing tobacco, or electronic cigarettes.  If you are pregnant, do not drink alcohol.  If you are breastfeeding, limit how much and how often you drink alcohol.  Limit alcohol intake to no more than 1 drink per day for nonpregnant women. One drink equals 12 ounces of beer, 5 ounces of wine, or 1 ounces of hard liquor.  Do not use street drugs.  Do not share needles.  Ask your health care provider for help if you need support or information about quitting drugs.  Tell your health care provider if you often feel depressed.  Tell your health care provider if you have ever been abused or do not feel safe at home. This information is not intended to replace advice given to you by your health care provider. Make sure you discuss any questions you have with your health care provider. Document Released: 05/16/2011 Document Revised: 04/07/2016 Document Reviewed: 08/04/2015 Elsevier Interactive Patient Education  2017 [employer].

## 2017-03-17 NOTE — Progress Notes (Addendum)
Subjective:   Meghan Vang is a 71 y.o. female who presents for an Initial Medicare Annual Wellness Visit.  Review of Systems    No ROS.  Medicare Wellness Visit.  Cardiac Risk Factors include: advanced age (>47men, >46 women);hypertension;family history of premature cardiovascular disease;obesity (BMI >30kg/m2)   Sleep patterns: Sleeps 6 hours, feels rested. Up to void x 1 occasionally.  Home Safety/Smoke Alarms:  Smoke detectors and security in place.  Living environment; residence and Firearm Safety: Lives alone in 1 story home, feels safe home. Son lives close.  Seat Belt Safety/Bike Helmet: Wears seat belt.   Counseling:   Eye Exam-Last exam > 5 years. Denies vision changes.  Dental-Last exam > 2 years. Only with issues. Dental resources provided.   Female:   Pap-NA       Mammo-05/27/2014, negative. Declines testing at this time.        Dexa scan-11/14/2013, Osteopenia.  Order placed.       CCS-Colonoscopy > 5 years. Cologuard 04/2016, negative.        Objective:    Today's Vitals   03/17/17 0806  BP: 127/79  Pulse: 71  Resp: 20  Temp: 97.9 F (36.6 C)  SpO2: 97%  Weight: 203 lb (92.1 kg)  Height: 5\' 2"  (1.575 m)   Body mass index is 37.13 kg/m.   Current Medications (verified) Outpatient Encounter Prescriptions as of 03/17/2017  Medication Sig  . amLODipine (NORVASC) 10 MG tablet TAKE (1) TABLET BY MOUTH ONCE DAILY.  Marland Kitchen lisinopril (PRINIVIL,ZESTRIL) 40 MG tablet TAKE (1) TABLET BY MOUTH ONCE DAILY.  . [DISCONTINUED] amLODipine (NORVASC) 10 MG tablet TAKE (1) TABLET BY MOUTH ONCE DAILY.  . [DISCONTINUED] lisinopril (PRINIVIL,ZESTRIL) 40 MG tablet TAKE (1) TABLET BY MOUTH ONCE DAILY.   No facility-administered encounter medications on file as of 03/17/2017.     Allergies (verified) Bactrim [sulfamethoxazole-trimethoprim]   History: Past Medical History:  Diagnosis Date  . Allergy   . Anemia   . Chicken pox   . Depression   . Hypertension   .  Menopause    Past Surgical History:  Procedure Laterality Date  . TUBAL LIGATION     Family History  Problem Relation Age of Onset  . Colon cancer Father 36  . Diabetes Father   . Heart disease Father   . Asthma Mother   . Diabetes Mother   . Heart disease Mother   . Asthma Brother   . Heart disease Brother   . Liver cancer Maternal Grandmother   . Heart disease Maternal Grandfather   . Heart disease Paternal Grandmother   . Stroke Paternal Grandfather    Social History   Occupational History  . Not on file.   Social History Main Topics  . Smoking status: Never Smoker  . Smokeless tobacco: Never Used  . Alcohol use No  . Drug use: No  . Sexual activity: No    Tobacco Counseling Counseling given: Yes   Activities of Daily Living In your present state of health, do you have any difficulty performing the following activities: 03/17/2017  Hearing? N  Vision? N  Difficulty concentrating or making decisions? N  Walking or climbing stairs? N  Dressing or bathing? N  Doing errands, shopping? N  Preparing Food and eating ? N  Using the Toilet? N  In the past six months, have you accidently leaked urine? N  Do you have problems with loss of bowel control? N  Managing your Medications? N  Managing your Finances?  N  Housekeeping or managing your Housekeeping? N  Some recent data might be hidden    Immunizations and Health Maintenance Immunization History  Administered Date(s) Administered  . Pneumococcal Conjugate-13 01/19/2016  . Tdap 01/19/2016   There are no preventive care reminders to display for this patient.  Patient Care Team: Ma Hillock, DO as PCP - General (Family Medicine)  Indicate any recent Medical Services you may have received from other than [facility] providers in the past year (date may be approximate).     Assessment:   This is a routine wellness examination for Meghan Vang. Physical assessment deferred to PCP.   Hearing/Vision screen Hearing  Screening Comments: Able to hear conversational tones w/o difficulty. No issues reported.   Vision Screening Comments: Wears reading glasses.   Dietary issues and exercise activities discussed: Current Exercise Habits: The patient does not participate in regular exercise at present (Stays active w/3 yo granddaughter), Exercise limited by: None identified   Diet (meal preparation, eat out, water intake, caffeinated beverages, dairy products, fruits and vegetables): Drinks coffee and water.   Breakfast: coffee, bagel, egg, frozen breakfast sandwich, [place] bacon, cereal, fruit Lunch: salad, grilled chicken, seafood Dinner: typically skips    Discussed heart healthy diet, protein sources and importance of not skipping meals. Encouraged to increase activity.   Goals    . Weight (lb) < 175 lb (79.4 kg)          Lose weight by increasing activity and eating more frequently (not skipping meals).       Depression Screen PHQ 2/9 Scores 03/17/2017 08/16/2016  PHQ - 2 Score 0 0    Fall Risk Fall Risk  03/17/2017 08/16/2016  Falls in the past year? No Yes  Number falls in past yr: - 1  Injury with Fall? - No  Risk for fall due to : - Impaired balance/gait  Follow up - Falls prevention discussed    Cognitive Function:       Ad8 score reviewed for issues:  Issues making decisions: no  Less interest in hobbies / activities: no  Repeats questions, stories (family complaining): no  Trouble using ordinary gadgets (microwave, computer, phone): no  Forgets the month or year: no  Mismanaging finances: no  Remembering appts: no  Daily problems with thinking and/or memory: no Ad8 score is=0     Screening Tests Health Maintenance  Topic Date Due  . PNA vac Low Risk Adult (2 of 2 - PPSV23) 08/14/2017 (Originally 01/18/2017)  . INFLUENZA VACCINE  08/16/2017 (Originally 06/14/2017)  . MAMMOGRAM  03/17/2018 (Originally 05/27/2016)  . Fecal DNA (Cologuard)  04/15/2019  . TETANUS/TDAP   01/18/2026  . DEXA SCAN  Completed  . Hepatitis C Screening  Completed  Declines mammogram Declines PPSV23 Considering Shingrix.     Plan:    Bring a copy of your advance directives to your next office visit.  Continue doing brain stimulating activities (puzzles, reading, adult coloring books, staying active) to keep memory sharp.   Protein Shakes: Syntha 6 Edge or Muscle Milk Light  Schedule bone scan.   Check with insurance/pharmacy for Shingrix coverage.   I have personally reviewed and noted the following in the patient's chart:   . Medical and social history . Use of alcohol, tobacco or illicit drugs  . Current medications and supplements . Functional ability and status . Nutritional status . Physical activity . Advanced directives . List of other physicians . Hospitalizations, surgeries, and ER visits in previous 12 months . Vitals .  Screenings to include cognitive, depression, and falls . Referrals and appointments  In addition, I have reviewed and discussed with patient certain preventive protocols, quality metrics, and best practice recommendations. A written personalized care plan for preventive services as well as general preventive health recommendations were provided to patient.     Gerilyn Nestle, RN   03/17/2017    ___________________________________________________________ Juluis Rainier:  Ordered DEXA scan, declines mammogram.           Considering Shingrix (determining coverage)           Medical screening examination/treatment/procedure(s) were performed by non-physician practitioner and as supervising physician I was immediately available for consultation/collaboration.  I agree with above assessment and plan.  Electronically Signed by: Howard Pouch, DO [facility] [place]

## 2017-03-17 NOTE — Progress Notes (Signed)
Meghan Vang , 1946/01/09, 71 y.o., female MRN: [id number] Patient Care Team    Relationship Specialty Notifications Start End  Ma Hillock, DO PCP - General Family Medicine  01/19/16     Chief Complaint  Patient presents with  . Hypertension     Subjective:  Prediabetes/BMI 37.0-37.9, adult Last a1c 6 months ago 5.8. She tries to watch her diet. Endorses not making time for her self and her own health because she is a caretaker to many. She has difficulty exercising with her feet pain (bunions).   Essential hypertension, benign Pt reports compliance with amlodipine 10  and Lisinopril 40. Blood pressures ranges at home WNL. Patient denies chest pain, shortness of breath or lower extremity edema. Pt has not yet started a daily baby ASA. Pt is not prescribed statin, but does take fish oil supplement. BMP: 08/16/2016 WNL CBC: 08/16/2016 WNL Lipid: 08/16/2016 normal, with trig 153--> started fish oil.  Diet: She is trying to more healthy and low sodium.  Exercise: Does not exercise, but is willing to put herself first and start  Depression screen [facility] 2/9 08/16/2016  Decreased Interest 0  Down, Depressed, Hopeless 0  PHQ - 2 Score 0    Allergies  Allergen Reactions  . Bactrim [Sulfamethoxazole-Trimethoprim]    Social History  Substance Use Topics  . Smoking status: Never Smoker  . Smokeless tobacco: Never Used  . Alcohol use No   Past Medical History:  Diagnosis Date  . Allergy   . Anemia   . Chicken pox   . Depression   . Hypertension   . Menopause    Past Surgical History:  Procedure Laterality Date  . TUBAL LIGATION     Family History  Problem Relation Age of Onset  . Colon cancer Father 53  . Diabetes Father   . Heart disease Father   . Asthma Mother   . Diabetes Mother   . Heart disease Mother   . Asthma Brother   . Heart disease Brother   . Liver cancer Maternal Grandmother   . Heart disease Maternal Grandfather   . Heart disease Paternal  Grandmother   . Stroke Paternal Grandfather    Allergies as of 03/17/2017      Reactions   Bactrim [sulfamethoxazole-trimethoprim]       Medication List       Accurate as of 03/17/17  8:24 AM. Always use your most recent med list.          amLODipine 10 MG tablet Commonly known as:  NORVASC TAKE (1) TABLET BY MOUTH ONCE DAILY.   lisinopril 40 MG tablet Commonly known as:  PRINIVIL,ZESTRIL TAKE (1) TABLET BY MOUTH ONCE DAILY.       All past medical history, surgical history, allergies, family history, immunizations andmedications were updated in the EMR today and reviewed under the history and medication portions of their EMR.     ROS: Negative, with the exception of above mentioned in HPI   Objective:  BP 127/79 (BP Location: Right Arm, Patient Position: Sitting, Cuff Size: Large)   Pulse 71   Temp 97.9 F (36.6 C)   Resp 20   Ht 5\' 2"  (1.575 m)   Wt 203 lb (92.1 kg)   SpO2 97%   BMI 37.13 kg/m  Body mass index is 37.13 kg/m. Gen: Afebrile. No acute distress. Nontoxic in appearance, well developed, well nourished.  HENT: AT. [place].  MMM, no oral lesions. Eyes:Pupils Equal Round Reactive to light, Extraocular movements  intact,  Conjunctiva without redness, discharge or icterus. Neck/lymp/endocrine: Supple,no lymphadenopathy CV: RRR no murmur, trace edema Chest: CTAB, no wheeze or crackles. Good air movement, normal resp effort.  Abd: Soft. NTND. BS present. Neuro:  Normal gait. PERLA. EOMi. Alert. Oriented x3  Psych: Normal affect, dress and demeanor. Normal speech. Normal thought content and judgment.  No exam data present No results found. Results for orders placed or performed in visit on 03/17/17 (from the past 24 hour(s))  POCT HgB A1C     Status: Normal   Collection Time: 03/17/17  8:21 AM  Result Value Ref Range   Hemoglobin A1C 5.4     Assessment/Plan: Oral Hallgren is a 71 y.o. female present for OV for  Prediabetes - A1c 5.8--> 5.4. Great news. -  Back to checking during physicals only.  - POCT HgB A1C  Essential hypertension, benign - Stable today - taking fish oil - refills on meds.  - start baby ASA if able.  - lisinopril (PRINIVIL,ZESTRIL) 40 MG tablet; TAKE (1) TABLET BY MOUTH ONCE DAILY.  Dispense: 90 tablet; Refill: 1 - amLODipine (NORVASC) 10 MG tablet; TAKE (1) TABLET BY MOUTH ONCE DAILY.  Dispense: 90 tablet; Refill: 1 BMP: 08/16/2016 WNL CBC: 08/16/2016 WNL Lipid: 08/16/2016 normal, with trig 153--> started fish oil.  Diet: She is trying to more healthy and low sodium.  Exercise: Does not exercise, but is willing to put herself first and start--> prep program info given.  -f/u 6 months  BMI 37.0-37.9, adult Diet and exercise counsel.  PREP program  Reviewed expectations re: course of current medical issues.  Discussed self-management of symptoms.  Outlined signs and symptoms indicating need for more acute intervention.  Patient verbalized understanding and all questions were answered.  Patient received an After-Visit Summary.   Note is dictated utilizing voice recognition software. Although note has been proof read prior to signing, occasional typographical errors still can be missed. If any questions arise, please do not hesitate to call for verification.   electronically signed by:  Howard Pouch, DO  [place]

## 2017-06-14 NOTE — Progress Notes (Signed)
[facility] Report   Patient Details  Name: Meghan Vang MRN: [id number] Date of Birth: 1946-04-14 Age: 71 y.o. PCP: Howard Pouch A, DO  Vitals:   06/14/17 1054  BP: (!) 150/94  Pulse: 90  Weight: 203 lb (92.1 kg)         [facility] Eval - 06/14/17 1000      Referral    Referring Provider dr. Raoul Pitch   Reason for referral Hypertension;Inactivity;Obesitity/Overweight;Orthopedic     Measurement   Waist Circumference 39 inches   Hip Circumference 51.5 inches   Body fat --  "too high to measure"     Timed Up and Go (TUGS)   Timed Up and Go Moderate risk 10-12 seconds     Mobility and Daily Activities   I find it easy to walk up or down two or more flights of stairs. 1   I have no trouble taking out the trash. 3   I do housework such as vacuuming and dusting on my own without difficulty. 3   I can easily lift a gallon of milk (8lbs). 3   I can easily walk a mile. 1   I have no trouble reaching into high cupboards or reaching down to pick up something from the floor. 3   I do not have trouble doing out-door work such as Armed forces logistics/support/administrative officer, raking leaves, or gardening. 1     Mobility and Daily Activities   I feel younger than my age. 3   I feel independent. 3   I feel energetic. 2   I live an active life.  2   I feel strong. 2   I feel healthy. 3   I feel active as other people my age. 2     How fit and strong are you.   Fit and Strong Total Score 32     Past Medical History:  Diagnosis Date  . Allergy   . Anemia   . Chicken pox   . Depression   . Hypertension   . Menopause    Past Surgical History:  Procedure Laterality Date  . TUBAL LIGATION     History  Smoking Status  . Never Smoker  Smokeless Tobacco  . Never Used     Starting the program today!   [name] 06/14/2017, 10:59 AM

## 2017-06-19 NOTE — Progress Notes (Signed)
[facility] PREP Weekly Session   Patient Details  Name: Meghan Vang MRN: [id number] Date of Birth: July 28, 1946 Age: 71 y.o. PCP: Ma Hillock, DO  Vitals:   06/19/17 1434  Weight: 203 lb 3.2 oz (92.2 kg)        [facility] Weekly seesion - 06/19/17 1400      Weekly Session   Topic Discussed Water   Classes attended to date 1     Things you are grateful for:"good health; great family" Barriers:"feet always some what of an issue"  [name] 06/19/2017, 2:34 PM

## 2017-06-21 NOTE — Progress Notes (Signed)
[facility] PREP Weekly Session   Patient Details  Name: Meghan Vang MRN: [id number] Date of Birth: 1946/06/04 Age: 71 y.o. PCP: Ma Hillock, DO  Vitals:   06/21/17 1311  Weight: 202 lb 9.6 oz (91.9 kg)        [facility] Weekly seesion - 06/21/17 1300      Weekly Session   Topic Discussed Hitting roadblocks   Minutes exercised this week 90 minutes  30cardio/60strength   Classes attended to date 2     Fun things you did since last meeting:"spent day w/my granddaughter" Things you are grateful for:"good health, family" Nutrition celebrations:"lost a smidgen of weight, increased water consumption" Barriers:'family (blind brother) issues"  [name] 06/21/2017, 1:12 PM

## 2017-09-11 NOTE — Progress Notes (Signed)
[facility] PREP Weekly Session   Patient Details  Name: Meghan Vang MRN: [id number] Date of Birth: [birth year] Age: 71 y.o. PCP: Ma Hillock, DO  Vitals:   08/23/17 0914  Weight: 200 lb 1.6 oz (90.8 kg)        [facility] Weekly seesion - 09/11/17 0900      Weekly Session   Topic Discussed Importance of resistance training   Minutes exercised this week 135 minutes  60cardio/15strength/73flexibility   Classes attended to date 3      Fun things you did since last meeting:"kept granddaughter" Things you are grateful for:"Family + health" Nutrition celebrations:"ate good food for me" Barriers:"family emergencies"  [name] 09/11/2017, 9:15 AM

## 2017-09-11 NOTE — Progress Notes (Signed)
[facility] PREP Weekly Session   Patient Details  Name: Meghan Vang MRN: [id number] Date of Birth: 1945-12-09 Age: 72 y.o. PCP: Ma Hillock, DO  Vitals:   09/06/17 7614  Weight: 198 lb 9.6 oz (90.1 kg)        [facility] Weekly seesion - 09/11/17 0900      Weekly Session   Topic Discussed Importance of resistance training   Minutes exercised this week 135 minutes  60cardio/15strength/32flexibility   Classes attended to date 3     Fun things you did since last meeting:"nephews ball game." Things you are grateful for:"family + good health" Barriers:"family responsibility"   [name] 09/11/2017, 9:27 AM

## 2017-09-19 ENCOUNTER — Ambulatory Visit (INDEPENDENT_AMBULATORY_CARE_PROVIDER_SITE_OTHER): Payer: PPO | Admitting: Family Medicine

## 2017-09-19 ENCOUNTER — Encounter: Payer: Self-pay | Admitting: Family Medicine

## 2017-09-19 VITALS — BP 128/81 | HR 72 | Temp 98.5°F | Resp 20 | Ht 62.0 in | Wt 197.5 lb

## 2017-09-19 DIAGNOSIS — R7303 Prediabetes: Secondary | ICD-10-CM | POA: Diagnosis not present

## 2017-09-19 DIAGNOSIS — I10 Essential (primary) hypertension: Secondary | ICD-10-CM | POA: Diagnosis not present

## 2017-09-19 DIAGNOSIS — M8580 Other specified disorders of bone density and structure, unspecified site: Secondary | ICD-10-CM | POA: Diagnosis not present

## 2017-09-19 DIAGNOSIS — Z1231 Encounter for screening mammogram for malignant neoplasm of breast: Secondary | ICD-10-CM

## 2017-09-19 DIAGNOSIS — Z1239 Encounter for other screening for malignant neoplasm of breast: Secondary | ICD-10-CM

## 2017-09-19 DIAGNOSIS — Z23 Encounter for immunization: Secondary | ICD-10-CM

## 2017-09-19 DIAGNOSIS — Z0001 Encounter for general adult medical examination with abnormal findings: Secondary | ICD-10-CM

## 2017-09-19 DIAGNOSIS — M810 Age-related osteoporosis without current pathological fracture: Secondary | ICD-10-CM | POA: Insufficient documentation

## 2017-09-19 LAB — COMPREHENSIVE METABOLIC PANEL
ALT: 12 U/L (ref 0–35)
AST: 13 U/L (ref 0–37)
Albumin: 4.3 g/dL (ref 3.5–5.2)
Alkaline Phosphatase: 69 U/L (ref 39–117)
BILIRUBIN TOTAL: 0.5 mg/dL (ref 0.2–1.2)
BUN: 18 mg/dL (ref 6–23)
CALCIUM: 9.5 mg/dL (ref 8.4–10.5)
CO2: 27 meq/L (ref 19–32)
CREATININE: 0.8 mg/dL (ref 0.40–1.20)
Chloride: 104 mEq/L (ref 96–112)
GFR: 75.06 mL/min (ref >60.00)
GLUCOSE: 100 mg/dL — AB (ref 70–99)
Potassium: 4.3 mEq/L (ref 3.5–5.1)
SODIUM: 139 meq/L (ref 135–145)
Total Protein: 7.3 g/dL (ref 6.0–8.3)

## 2017-09-19 LAB — HEMOGLOBIN A1C: HEMOGLOBIN A1C: 5.7 % (ref 4.6–6.5)

## 2017-09-19 LAB — CBC
HEMATOCRIT: 42.6 % (ref 36.0–46.0)
Hemoglobin: 13.8 g/dL (ref 12.0–15.0)
MCHC: 32.4 g/dL (ref 30.0–36.0)
MCV: 92.5 fl (ref 78.0–100.0)
Platelets: 263 10*3/uL (ref 150.0–400.0)
RBC: 4.61 Mil/uL (ref 3.87–5.11)
RDW: 14 % (ref 11.5–15.5)
WBC: 6.3 10*3/uL (ref 4.0–10.5)

## 2017-09-19 LAB — TSH: TSH: 0.97 u[IU]/mL (ref 0.35–4.50)

## 2017-09-19 MED ORDER — LISINOPRIL 40 MG PO TABS: ORAL_TABLET | ORAL | 1 refills | Status: DC

## 2017-09-19 MED ORDER — AMLODIPINE BESYLATE 10 MG PO TABS
ORAL_TABLET | ORAL | 1 refills | Status: DC
Start: 2017-09-19 — End: 2018-03-13

## 2017-09-19 NOTE — Progress Notes (Signed)
Patient ID: Belkys Henault, female   DOB: 1946/11/07, 71 y.o.   MRN: [id number]      Patient ID: Tolulope Pinkett, female  DOB: 1946-10-18, 71 y.o.   MRN: [id number]  Subjective:  Katy Brickell is a 71 y.o. female present for CPE. All past medical history, surgical history, allergies, family history, immunizations, medications and social history were obtained and entered in the electronic medical record today. All recent labs, ED visits and hospitalizations within the last year were reviewed.  Hypertension:  Pt reports compliance with lisinopril 40 and amlodipine 10 . Blood pressures ranges at home WNL. Patient denies chest pain, shortness of breath or lower extremity edema. Pt does not take  daily baby ASA. Pt is not  prescribed statin. CBC, CMP 2017 nomral, lipids good 2017 Diet: low sodium Exercise: routinely RF: HTN, Fhx heart disease and stroke, Morbid obesity   Health maintenance: updated 09/19/2017 Colonoscopy: Fhx in father. Cologuard neg 04/2016, rpt 3 years.   Mammogram: No Fhx. Last mammogram. 2015 bi rads 1 ([place] in another state)--> ordered today Cervical cancer screening: not indicated. Immunizations: declined flu shot today. tdap UTD 01/19/2016 provided, prevnar 13 01/19/2016, pneumovax 23 provided today Infectious disease screening: hep c completed DEXA: 2015; osteopenia. Told to take calcium and vitamin D use. --> ordered today  Past Medical History:  Diagnosis Date  . Allergy   . Anemia   . Chicken pox   . Depression   . Hypertension   . Menopause    Allergies  Allergen Reactions  . Bactrim [Sulfamethoxazole-Trimethoprim]    Past Surgical History:  Procedure Laterality Date  . TUBAL LIGATION     Family History  Problem Relation Age of Onset  . Colon cancer Father 48  . Diabetes Father   . Heart disease Father   . Asthma Mother   . Diabetes Mother   . Heart disease Mother   . Asthma Brother   . Heart disease Brother   . Liver cancer Maternal  Grandmother   . Heart disease Maternal Grandfather   . Heart disease Paternal Grandmother   . Stroke Paternal Grandfather    Social History   Socioeconomic History  . Marital status: Single    Spouse name: Not on file  . Number of children: Not on file  . Years of education: Not on file  . Highest education level: Not on file  Social Needs  . Financial resource strain: Not on file  . Food insecurity - worry: Not on file  . Food insecurity - inability: Not on file  . Transportation needs - medical: Not on file  . Transportation needs - non-medical: Not on file  Occupational History  . Not on file  Tobacco Use  . Smoking status: Never Smoker  . Smokeless tobacco: Never Used  Substance and Sexual Activity  . Alcohol use: No    Alcohol/week: 0.0 oz  . Drug use: No  . Sexual activity: No  Other Topics Concern  . Not on file  Social History Narrative   Divorced. 2 sons, 1 daughter. [name], [name]).   Some college. Retired. Moved from [place] to be next to family.    Drinks caffeine.    Wears her seatbelt and bike helmet.    Smoke detector in the home.    Feels safe in her relationships.      ROS: Negative, with the exception of above mentioned in HPI  Objective: BP 128/81 (BP Location: Left Arm, Patient Position: Sitting, Cuff Size: Large)  Pulse 72   Temp 98.5 F (36.9 C)   Resp 20   Ht 5' 2"  (1.575 m)   Wt 197 lb 8 oz (89.6 kg)   SpO2 97%   BMI 36.12 kg/m  Gen: Afebrile. No acute distress. Nontoxic in appearance, well developed, well nourished, obese, pleasant caucasian female.  HENT: AT. . Bilateral TM visualized and normal in appearance. MMM. Bilateral nares without erythema or swelling. Throat without erythema or exudates. No cough, no hoarseness.  Eyes:Pupils Equal Round Reactive to light, Extraocular movements intact,  Conjunctiva without redness, discharge or icterus. Neck/lymp/endocrine: Supple,no lymphadenopathy, no thyromegaly CV: RRR no murmur, no  edema, +2/4 P posterior tibialis pulses Chest: CTAB, no wheeze or crackles Abd: Soft. obese. NTND. BS present. no Masses palpated.  MSK: no erythema, no swelling, no obvious deformities, NROM. NV intact.  Skin: no rashes, purpura or petechiae.  Neuro:  Normal gait. PERLA. EOMi. Alert. Oriented x3 Cranial nerves II through XII intact. DTRs equal bilaterally. Psych: Normal affect, dress and demeanor. Normal speech. Normal thought content and judgment.   Assessment/plan: Keaira Whitehurst is a 71 y.o. female present for CPE.   Encounter for preventive health examination Patient was encouraged to exercise greater than 150 minutes a week. Patient was encouraged to choose a diet filled with fresh fruits and vegetables, and lean meats. AVS provided to patient today for education/recommendation on gender specific health and safety maintenance. Colonoscopy: Fhx in father. Cologuard neg 04/2016, rpt 3 years.   Mammogram: No Fhx. Last mammogram. 2015 bi rads 1 (completed in another state)--> ordered today Cervical cancer screening: not indicated. Immunizations: declined flu shot today. tdap UTD 01/19/2016 provided, prevnar 13 01/19/2016, pneumovax 23 provided today Infectious disease screening: hep c completed DEXA: 2015; osteopenia. Told to take calcium and vitamin D use. --> ordered today  Essential hypertension, benign/morbid obesity - stable, continue amlodipine and lisinopril. - low salt diet. Exercise. - CBC - HgB A1c - TSH - Comp Met (CMET) - lisinopril (PRINIVIL,ZESTRIL) 40 MG tablet; TAKE (1) TABLET BY MOUTH ONCE DAILY.  Dispense: 90 tablet; Refill: 1 - amLODipine (NORVASC) 10 MG tablet; TAKE (1) TABLET BY MOUTH ONCE DAILY.  Dispense: 90 tablet; Refill: 1 - F/U 6 months.  Prediabetes - 5.6 --> 5.4, 6 months ago. Repeat today.  - CBC - HgB A1c - TSH - Comp Met (CMET) Need for 23-polyvalent pneumococcal polysaccharide vaccine - series now completed.  - Pneumococcal polysaccharide vaccine  23-valent greater than or equal to 2yo subcutaneous/IM Osteopenia, unspecified location - encouraged calcium and 1000 u vit d daily  - DG Bone Density; Future Breast cancer screening - MM DIGITAL SCREENING BILATERAL; Future    Return in about 1 year (around 09/19/2018) for CPE. 6 month for hypertension.  Electronically signed by: Howard Pouch, DO [place]

## 2017-09-19 NOTE — Patient Instructions (Signed)

## 2017-10-11 NOTE — Progress Notes (Signed)
[facility] PREP Weekly Session   Patient Details  Name: Neetu Carrozza MRN: [id number] Date of Birth: 07/24/1946 Age: 71 y.o. PCP: Ma Hillock, DO  Vitals:   10/11/17 1321  Weight: 197 lb 9.6 oz (89.6 kg)    [facility] Weekly seesion - 10/11/17 1300      Weekly Session   Topic Discussed  Eating for the season    Minutes exercised this week  270 minutes cardio   cardio   Classes attended to date  5      Things you are grateful for:"family and good health" Nutrition celebrations:"limiting carbs; eating more protein" Barriers:"son, granddaughter, brother, family responsibilities"  [name] 10/11/2017, 1:22 PM

## 2017-11-30 ENCOUNTER — Other Ambulatory Visit: Payer: PPO

## 2017-11-30 ENCOUNTER — Ambulatory Visit: Payer: PPO

## 2018-01-01 ENCOUNTER — Ambulatory Visit: Payer: PPO

## 2018-01-01 ENCOUNTER — Other Ambulatory Visit: Payer: PPO

## 2018-03-12 ENCOUNTER — Ambulatory Visit: Payer: PPO

## 2018-03-12 ENCOUNTER — Other Ambulatory Visit: Payer: PPO

## 2018-03-13 ENCOUNTER — Telehealth: Payer: Self-pay | Admitting: Family Medicine

## 2018-03-13 ENCOUNTER — Other Ambulatory Visit: Payer: Self-pay | Admitting: *Deleted

## 2018-03-13 DIAGNOSIS — I10 Essential (primary) hypertension: Secondary | ICD-10-CM

## 2018-03-13 MED ORDER — LISINOPRIL 40 MG PO TABS: ORAL_TABLET | ORAL | 0 refills | Status: DC

## 2018-03-13 MED ORDER — AMLODIPINE BESYLATE 10 MG PO TABS: ORAL_TABLET | ORAL | 0 refills | Status: DC

## 2018-03-13 NOTE — Telephone Encounter (Unsigned)
Copied from [place] [id number]. Topic: Quick Communication - See Telephone Encounter >> Mar 13, 2018  7:17 AM Hewitt Shorts wrote: Pt is needing a refill on amlodipine and lisinopril [facility] [phone]  Best number to call [phone]

## 2018-03-13 NOTE — Telephone Encounter (Signed)
Left message to call back- patient due 6 month f/u for BP.

## 2018-03-13 NOTE — Telephone Encounter (Signed)
Refill sent to pharmacy- 90 day with no additional.

## 2018-03-13 NOTE — Telephone Encounter (Signed)
Pt has appt on 6.17.19. @ 8am

## 2018-03-20 ENCOUNTER — Ambulatory Visit: Payer: PPO

## 2018-04-30 ENCOUNTER — Ambulatory Visit (INDEPENDENT_AMBULATORY_CARE_PROVIDER_SITE_OTHER): Payer: PPO | Admitting: Family Medicine

## 2018-04-30 ENCOUNTER — Encounter: Payer: Self-pay | Admitting: Family Medicine

## 2018-04-30 VITALS — BP 132/78 | HR 86 | Temp 98.4°F | Resp 20 | Ht 62.0 in | Wt 202.4 lb

## 2018-04-30 DIAGNOSIS — I10 Essential (primary) hypertension: Secondary | ICD-10-CM | POA: Diagnosis not present

## 2018-04-30 MED ORDER — AMLODIPINE BESYLATE 10 MG PO TABS: ORAL_TABLET | ORAL | 1 refills | Status: DC

## 2018-04-30 MED ORDER — LISINOPRIL 40 MG PO TABS: ORAL_TABLET | ORAL | 1 refills | Status: DC

## 2018-04-30 NOTE — Patient Instructions (Signed)
It was a pleasure seeing you  Today. Refills on medication provided.   Please help [place] help you:  We are honored you have chosen [facility] for your Primary Care home. Below you will find basic instructions that you may need to access in the future. Please help [place] help you by reading the instructions, which cover many of the frequent questions we experience.   Prescription refills and request:  -In order to allow more efficient response time, please call your pharmacy for all refills. They will forward the request electronically to [place]. This allows for the quickest possible response. Request left on a nurse line can take longer to refill, since these are checked as time allows between office patients and other phone calls.  - refill request can take up to 3-5 working days to complete.  - If request is sent electronically and request is appropiate, it is usually completed in 1-2 business days.  - all patients will need to be seen routinely for all chronic medical conditions requiring prescription medications (see follow-up below). If you are overdue for follow up on your condition, you will be asked to make an appointment and we will call in enough medication to cover you until your appointment (up to 30 days).  - all controlled substances will require a face to face visit to request/refill.  - if you desire your prescriptions to go through a new pharmacy, and have an active script at original pharmacy, you will need to call your pharmacy and have scripts transferred to new pharmacy. This is completed between the pharmacy locations and not by your provider.    Results: If any images or labs were ordered, it can take up to 1 week to get results depending on the test ordered and the lab/facility running and resulting the test. - Normal or stable results, which do not need further discussion, may be released to your mychart immediately with attached note to you. A call may not be generated for normal  results. Please make certain to sign up for mychart. If you have questions on how to activate your mychart you can call the front office.  - If your results need further discussion, our office will attempt to contact you via phone, and if unable to reach you after 2 attempts, we will release your abnormal result to your mychart with instructions.  - All results will be automatically released in mychart after 1 week.  - Your provider will provide you with explanation and instruction on all relevant material in your results. Please keep in mind, results and labs may appear confusing or abnormal to the untrained eye, but it does not mean they are actually abnormal for you personally. If you have any questions about your results that are not covered, or you desire more detailed explanation than what was provided, you should make an appointment with your provider to do so.   Our office handles many outgoing and incoming calls daily. If we have not contacted you within 1 week about your results, please check your mychart to see if there is a message first and if not, then contact our office.  In helping with this matter, you help decrease call volume, and therefore allow [place] to be able to respond to patients needs more efficiently.   Acute office visits (sick visit):  An acute visit is intended for a new problem and are scheduled in shorter time slots to allow schedule openings for patients with new problems. This is  the appropriate visit to discuss a new problem. Problems will not be addressed by phone call or Echart message. Appointment is needed if requesting treatment. In order to provide you with excellent quality medical care with proper time for you to explain your problem, have an exam and receive treatment with instructions, these appointments should be limited to one new problem per visit. If you experience a new problem, in which you desire to be addressed, please make an acute office visit, we save  openings on the schedule to accommodate you. Please do not save your new problem for any other type of visit, let us take care of it properly and quickly for you.   Follow up visits:  Depending on your condition(s) your provider will need to see you routinely in order to provide you with quality care and prescribe medication(s). Most chronic conditions (Example: hypertension, Diabetes, depression/anxiety... etc), require visits a couple times a year. Your provider will instruct you on proper follow up for your personal medical conditions and history. Please make certain to make follow up appointments for your condition as instructed. Failing to do so could result in lapse in your medication treatment/refills. If you request a refill, and are overdue to be seen on a condition, we will always provide you with a 30 day script (once) to allow you time to schedule.    Medicare wellness (well visit): - we have a wonderful Nurse Maudie Mercury), that will meet with you and provide you will yearly medicare wellness visits. These visits should occur yearly (can not be scheduled less than 1 calendar year apart) and cover preventive health, immunizations, advance directives and screenings you are entitled to yearly through your medicare benefits. Do not miss out on your entitled benefits, this is when medicare will pay for these benefits to be ordered for you.  These are strongly encouraged by your provider and is the appropriate type of visit to make certain you are up to date with all preventive health benefits. If you have not had your medicare wellness exam in the last 12 months, please make certain to schedule one by calling the office and schedule your medicare wellness with Maudie Mercury as soon as possible.   Yearly physical (well visit):  - Adults are recommended to be seen yearly for physicals. Check with your insurance and date of your last physical, most insurances require one calendar year between physicals. Physicals  include all preventive health topics, screenings, medical exam and labs that are appropriate for gender/age and history. You may have fasting labs needed at this visit. This is a well visit (not a sick visit), new problems should not be covered during this visit (see acute visit).  - Pediatric patients are seen more frequently when they are younger. Your provider will advise you on well child visit timing that is appropriate for your their age. - This is not a medicare wellness visit. Medicare wellness exams do not have an exam portion to the visit. Some medicare companies allow for a physical, some do not allow a yearly physical. If your medicare allows a yearly physical you can schedule the medicare wellness with our nurse Maudie Mercury and have your physical with your provider after, on the same day. Please check with insurance for your full benefits.   Late Policy/No Shows:  - all new patients should arrive 15-30 minutes earlier than appointment to allow [place] time  to  obtain all personal demographics,  insurance information and for you to complete office paperwork. -  All established patients should arrive 10-15 minutes earlier than appointment time to update all information and be checked in .  - In our best efforts to run on time, if you are late for your appointment you will be asked to either reschedule or if able, we will work you back into the schedule. There will be a wait time to work you back in the schedule,  depending on availability.  - If you are unable to make it to your appointment as scheduled, please call 24 hours ahead of time to allow [place] to fill the time slot with someone else who needs to be seen. If you do not cancel your appointment ahead of time, you may be charged a no show fee.

## 2018-04-30 NOTE — Progress Notes (Signed)
Meghan Vang , 1946-07-07, 72 y.o., female MRN: [id number] Patient Care Team    Relationship Specialty Notifications Start End  Ma Hillock, DO PCP - General Family Medicine  01/19/16     Chief Complaint  Patient presents with  . Hypertension     Subjective:  Essential hypertension, benign/morbid obesity Pt reports compliance with amlodipine 10  and Lisinopril 40. Blood pressures ranges at home WNL. Patient denies chest pain, shortness of breath, dizziness or lower extremity edema. Pt has not yet started a daily baby ASA. Pt is not prescribed statin, but does take fish oil supplement. BMP: 09/19/2017 WNL CBC: 09/19/2017 WNL Lipid: 08/16/2016 normal, with trig 153--> started fish oil.  Diet: She is trying to more healthy and low sodium.  Exercise: Does not exercise, but is willing to put herself first and start  Depression screen [facility] 2/9 09/19/2017 03/17/2017 08/16/2016  Decreased Interest 0 0 0  Down, Depressed, Hopeless 0 0 0  PHQ - 2 Score 0 0 0    Allergies  Allergen Reactions  . Bactrim [Sulfamethoxazole-Trimethoprim]    Social History   Tobacco Use  . Smoking status: Never Smoker  . Smokeless tobacco: Never Used  Substance Use Topics  . Alcohol use: No    Alcohol/week: 0.0 oz   Past Medical History:  Diagnosis Date  . Allergy   . Anemia   . Chicken pox   . Depression   . Hypertension   . Menopause    Past Surgical History:  Procedure Laterality Date  . TUBAL LIGATION     Family History  Problem Relation Age of Onset  . Colon cancer Father 25  . Diabetes Father   . Heart disease Father   . Asthma Mother   . Diabetes Mother   . Heart disease Mother   . Asthma Brother   . Heart disease Brother   . Liver cancer Maternal Grandmother   . Heart disease Maternal Grandfather   . Heart disease Paternal Grandmother   . Stroke Paternal Grandfather    Allergies as of 04/30/2018      Reactions   Bactrim [sulfamethoxazole-trimethoprim]       Medication  List        Accurate as of 04/30/18  8:13 AM. Always use your most recent med list.          amLODipine 10 MG tablet Commonly known as:  NORVASC TAKE (1) TABLET BY MOUTH ONCE DAILY.   lisinopril 40 MG tablet Commonly known as:  PRINIVIL,ZESTRIL TAKE (1) TABLET BY MOUTH ONCE DAILY.       All past medical history, surgical history, allergies, family history, immunizations andmedications were updated in the EMR today and reviewed under the history and medication portions of their EMR.     ROS: Negative, with the exception of above mentioned in HPI   Objective:  BP 132/78 (BP Location: Left Arm, Patient Position: Sitting, Cuff Size: Large)   Pulse 86   Temp 98.4 F (36.9 C)   Resp 20   Ht 5\' 2"  (1.575 m)   Wt 202 lb 6 oz (91.8 kg)   SpO2 97%   BMI 37.01 kg/m  Body mass index is 37.01 kg/m. Gen: Afebrile. No acute distress. Nontoxic in appearance, well developed, well nourished .obsese female.  HENT: AT. [place].  MMM.  Eyes:Pupils Equal Round Reactive to light, Extraocular movements intact,  Conjunctiva without redness, discharge or icterus. Neck/lymp/endocrine: Supple,no lymphadenopathy, no thyromegaly CV: RRR no murmur, no edema, +2/4 P  posterior tibialis pulses Chest: CTAB, no wheeze or crackles Abd: Soft. NTND. BS present.  Neuro:  Normal gait. PERLA. EOMi. Alert. Oriented x3  No exam data present No results found. No results found for this or any previous visit (from the past 24 hour(s)).  Assessment/Plan: Meghan Vang is a 72 y.o. female present for OV for  Essential hypertension, benign/Morbid obesity - Stable today. Refill on amlodipine and lisinopril . - taking fish oil - start baby ASA if able.  - lisinopril (PRINIVIL,ZESTRIL) 40 MG tablet; TAKE (1) TABLET BY MOUTH ONCE DAILY.  Dispense: 90 tablet; Refill: 1 - amLODipine (NORVASC) 10 MG tablet; TAKE (1) TABLET BY MOUTH ONCE DAILY.  Dispense: 90 tablet; Refill: 1 -f/u 6 months   Reviewed expectations re:  course of current medical issues.  Discussed self-management of symptoms.  Outlined signs and symptoms indicating need for more acute intervention.  Patient verbalized understanding and all questions were answered.  Patient received an After-Visit Summary.   Note is dictated utilizing voice recognition software. Although note has been proof read prior to signing, occasional typographical errors still can be missed. If any questions arise, please do not hesitate to call for verification.   electronically signed by:  Howard Pouch, DO  [place]

## 2018-08-24 ENCOUNTER — Ambulatory Visit: Payer: PPO

## 2018-08-24 ENCOUNTER — Other Ambulatory Visit: Payer: PPO

## 2018-09-20 ENCOUNTER — Encounter: Payer: Self-pay | Admitting: Family Medicine

## 2018-09-20 ENCOUNTER — Ambulatory Visit (INDEPENDENT_AMBULATORY_CARE_PROVIDER_SITE_OTHER): Payer: PPO | Admitting: Family Medicine

## 2018-09-20 VITALS — BP 131/83 | HR 79 | Temp 98.8°F | Resp 20 | Ht 61.5 in | Wt 199.0 lb

## 2018-09-20 DIAGNOSIS — M8580 Other specified disorders of bone density and structure, unspecified site: Secondary | ICD-10-CM

## 2018-09-20 DIAGNOSIS — Z23 Encounter for immunization: Secondary | ICD-10-CM | POA: Diagnosis not present

## 2018-09-20 DIAGNOSIS — Z0000 Encounter for general adult medical examination without abnormal findings: Secondary | ICD-10-CM

## 2018-09-20 DIAGNOSIS — R7309 Other abnormal glucose: Secondary | ICD-10-CM | POA: Diagnosis not present

## 2018-09-20 DIAGNOSIS — I10 Essential (primary) hypertension: Secondary | ICD-10-CM

## 2018-09-20 LAB — CBC WITH DIFFERENTIAL/PLATELET
BASOS PCT: 0.4 % (ref 0.0–3.0)
Basophils Absolute: 0 10*3/uL (ref 0.0–0.1)
EOS ABS: 0.1 10*3/uL (ref 0.0–0.7)
EOS PCT: 1 % (ref 0.0–5.0)
HEMATOCRIT: 42.9 % (ref 36.0–46.0)
HEMOGLOBIN: 14.2 g/dL (ref 12.0–15.0)
Lymphocytes Relative: 34.9 % (ref 12.0–46.0)
Lymphs Abs: 2.3 10*3/uL (ref 0.7–4.0)
MCHC: 33.1 g/dL (ref 30.0–36.0)
MCV: 91.7 fl (ref 78.0–100.0)
Monocytes Absolute: 0.4 10*3/uL (ref 0.1–1.0)
Monocytes Relative: 6.5 % (ref 3.0–12.0)
NEUTROS ABS: 3.8 10*3/uL (ref 1.4–7.7)
NEUTROS PCT: 57.2 % (ref 43.0–77.0)
Platelets: 272 10*3/uL (ref 150.0–400.0)
RBC: 4.67 Mil/uL (ref 3.87–5.11)
RDW: 13.8 % (ref 11.5–15.5)
WBC: 6.7 10*3/uL (ref 4.0–10.5)

## 2018-09-20 LAB — LIPID PANEL
CHOL/HDL RATIO: 3
Cholesterol: 160 mg/dL (ref 0–200)
HDL: 62.2 mg/dL (ref >39.00)
LDL Cholesterol: 71 mg/dL (ref 0–99)
NonHDL: 97.46
TRIGLYCERIDES: 133 mg/dL (ref 0.0–149.0)
VLDL: 26.6 mg/dL (ref 0.0–40.0)

## 2018-09-20 LAB — COMPREHENSIVE METABOLIC PANEL
ALBUMIN: 4.5 g/dL (ref 3.5–5.2)
ALT: 10 U/L (ref 0–35)
AST: 14 U/L (ref 0–37)
Alkaline Phosphatase: 65 U/L (ref 39–117)
BUN: 16 mg/dL (ref 6–23)
CHLORIDE: 105 meq/L (ref 96–112)
CO2: 27 meq/L (ref 19–32)
CREATININE: 0.89 mg/dL (ref 0.40–1.20)
Calcium: 9.5 mg/dL (ref 8.4–10.5)
GFR: 66.19 mL/min (ref >60.00)
GLUCOSE: 103 mg/dL — AB (ref 70–99)
Potassium: 4.1 mEq/L (ref 3.5–5.1)
SODIUM: 140 meq/L (ref 135–145)
Total Bilirubin: 0.4 mg/dL (ref 0.2–1.2)
Total Protein: 7.3 g/dL (ref 6.0–8.3)

## 2018-09-20 LAB — TSH: TSH: 1.31 u[IU]/mL (ref 0.35–4.50)

## 2018-09-20 LAB — HEMOGLOBIN A1C: Hgb A1c MFr Bld: 5.8 % (ref 4.6–6.5)

## 2018-09-20 MED ORDER — LISINOPRIL 40 MG PO TABS: ORAL_TABLET | ORAL | 1 refills | Status: DC

## 2018-09-20 MED ORDER — AMLODIPINE BESYLATE 10 MG PO TABS: ORAL_TABLET | ORAL | 1 refills | Status: DC

## 2018-09-20 MED ORDER — ZOSTER VAC RECOMB ADJUVANTED 50 MCG/0.5ML IM SUSR: 0.5000 mL | Freq: Once | INTRAMUSCULAR | 1 refills | Status: AC

## 2018-09-20 NOTE — Progress Notes (Signed)
Patient ID: Meghan Vang, female  DOB: 26-Sep-1946, 72 y.o.   MRN: [id number] Patient Care Team    Relationship Specialty Notifications Start End  Ma Hillock, DO PCP - General Family Medicine  01/19/16     Chief Complaint  Patient presents with  . Annual Exam    Subjective:  Meghan Vang is a 72 y.o.  Female  present for CPE . All past medical history, surgical history, allergies, family history, immunizations, medications and social history were updated in the electronic medical record today. All recent labs, ED visits and hospitalizations within the last year were reviewed.  Hypertension:  Pt reports compliance with lisinopril 40 and amlodipine 10 . Blood pressures ranges at home WNL. Patient denies chest pain, shortness of breath, dizziness or lower extremity edema.  Pt does not take  daily baby ASA. Pt is not  prescribed statin. Diet: low sodium Exercise: routinely RF: HTN, Fhx heart disease and stroke, Morbid obesity  Health maintenance: updated 09/20/18 Colonoscopy: Fhx in father. Cologuard neg 04/2016, rpt 3 years.   Mammogram: No Fhx. Last mammogram. 2015 bi rads 1 ([place])--> ordered today--> had to reschedule has it next week.  Cervical cancer screening: not indicated. Immunizations: flu shot provided today. tdap UTD 01/19/2016 provided, PNA series completed 2018. Shingrix script provided.  Infectious disease screening: hep c completed DEXA: 2015; osteopenia. Told to take calcium and vitamin D use. --> ordered today--> rescheduled for next week Assistive device: none Oxygen URK:YHCW Patient has a Dental home. Hospitalizations/ED visits: reviewed   Depression screen [facility] 2/9 09/20/2018 09/19/2017 03/17/2017 08/16/2016  Decreased Interest 0 0 0 0  Down, Depressed, Hopeless 0 0 0 0  PHQ - 2 Score 0 0 0 0  Altered sleeping 0 - - -  Tired, decreased energy 0 - - -  Change in appetite 0 - - -  Feeling bad or failure about yourself  0 - - -    Trouble concentrating 0 - - -  Suicidal thoughts 0 - - -  PHQ-9 Score 0 - - -  Difficult doing work/chores Not difficult at all - - -   No flowsheet data found.   Immunization History  Administered Date(s) Administered  . Influenza, High Dose Seasonal PF 09/20/2018  . Pneumococcal Conjugate-13 01/19/2016  . Pneumococcal Polysaccharide-23 09/19/2017  . Tdap 01/19/2016     Past Medical History:  Diagnosis Date  . Allergy   . Anemia   . Chicken pox   . Depression   . Hypertension   . Menopause    Allergies  Allergen Reactions  . Bactrim [Sulfamethoxazole-Trimethoprim]    Past Surgical History:  Procedure Laterality Date  . TUBAL LIGATION     Family History  Problem Relation Age of Onset  . Colon cancer Father 91  . Diabetes Father   . Heart disease Father   . Asthma Mother   . Diabetes Mother   . Heart disease Mother   . Asthma Brother   . Heart disease Brother   . Liver cancer Maternal Grandmother   . Heart disease Maternal Grandfather   . Heart disease Paternal Grandmother   . Stroke Paternal Grandfather    Social History   Socioeconomic History  . Marital status: Single    Spouse name: Not on file  . Number of children: Not on file  . Years of education: Not on file  . Highest education level: Not on file  Occupational History  . Not on file  Social Needs  . Financial resource strain: Not on file  . Food insecurity:    Worry: Not on file    Inability: Not on file  . Transportation needs:    Medical: Not on file    Non-medical: Not on file  Tobacco Use  . Smoking status: Never Smoker  . Smokeless tobacco: Never Used  Substance and Sexual Activity  . Alcohol use: No    Alcohol/week: 0.0 standard drinks  . Drug use: No  . Sexual activity: Never  Lifestyle  . Physical activity:    Days per week: Not on file    Minutes per session: Not on file  . Stress: Not on file  Relationships  . Social connections:    Talks on phone: Not on file     Gets together: Not on file    Attends religious service: Not on file    Active member of club or organization: Not on file    Attends meetings of clubs or organizations: Not on file    Relationship status: Not on file  . Intimate partner violence:    Fear of current or ex partner: Not on file    Emotionally abused: Not on file    Physically abused: Not on file    Forced sexual activity: Not on file  Other Topics Concern  . Not on file  Social History Narrative   Divorced. 2 sons, 1 daughter. [name], [name]).   Some college. Retired. Moved from [place] to be next to family.    Drinks caffeine.    Wears her seatbelt and bike helmet.    Smoke detector in the home.    Feels safe in her relationships.    Allergies as of 09/20/2018      Reactions   Bactrim [sulfamethoxazole-trimethoprim]       Medication List        Accurate as of 09/20/18  9:08 AM. Always use your most recent med list.          amLODipine 10 MG tablet Commonly known as:  NORVASC TAKE (1) TABLET BY MOUTH ONCE DAILY.   lisinopril 40 MG tablet Commonly known as:  PRINIVIL,ZESTRIL TAKE (1) TABLET BY MOUTH ONCE DAILY.   Zoster Vaccine Adjuvanted injection Commonly known as:  SHINGRIX Inject 0.5 mLs into the muscle once for 1 dose. Rpt dose in 2-6 mos.       All past medical history, surgical history, allergies, family history, immunizations andmedications were updated in the EMR today and reviewed under the history and medication portions of their EMR.     No results found for this or any previous visit (from the past 2160 hour(s)).  Patient was never admitted.   ROS: 14 pt review of systems performed and negative (unless mentioned in an HPI)  Objective: BP 131/83 (BP Location: Left Arm, Patient Position: Sitting, Cuff Size: Large)   Pulse 79   Temp 98.8 F (37.1 C)   Resp 20   Ht 5' 1.5" (1.562 m)   Wt 199 lb (90.3 kg)   SpO2 97%   BMI 36.99 kg/m  Gen: Afebrile. No acute distress. Nontoxic  in appearance, well-developed, well-nourished,  Very pleasant obese caucasian female.  HENT: AT. [place]. Bilateral TM visualized and normal in appearance, normal external auditory canal. MMM, no oral lesions, adequate dentition. Bilateral nares within normal limits. Throat without erythema, ulcerations or exudates. no Cough on exam, no hoarseness on exam. Eyes:Pupils Equal Round Reactive to light, Extraocular movements intact,  Conjunctiva  without redness, discharge or icterus. Neck/lymp/endocrine: Supple,no lymphadenopathy, no thyromegaly CV: RRR no murmur, no edema, +2/4 P posterior tibialis pulses. no carotid bruits. No JVD. Chest: CTAB, no wheeze, rhonchi or crackles. normal Respiratory effort. good Air movement. Abd: Soft. obese. NTND. BS present. no Masses palpated. No hepatosplenomegaly. No rebound tenderness or guarding. Skin: no rashes, purpura or petechiae. Warm and well-perfused. Skin intact. Neuro/Msk:  Normal gait. PERLA. EOMi. Alert. Oriented x3.  Cranial nerves II through XII intact. Muscle strength 5/5 upper/lower extremity. DTRs equal bilaterally. Psych: Normal affect, dress and demeanor. Normal speech. Normal thought content and judgment.  No exam data present  Assessment/plan: Meghan Vang is a 72 y.o. female present for CPE. Essential hypertension, benign/Morbid obesity (HCC) Stable. Continue amlodipine and lisinopril at current doses-refills provided.  - CBC w/Diff - Comp Met (CMET) - Lipid panel - TSH - amLODipine (NORVASC) 10 MG tablet; TAKE (1) TABLET BY MOUTH ONCE DAILY.  Dispense: 90 tablet; Refill: 1 - lisinopril (PRINIVIL,ZESTRIL) 40 MG tablet; TAKE (1) TABLET BY MOUTH ONCE DAILY.  Dispense: 90 tablet; Refill: 1 - f/u 6 months Elevated a1c - HgB A1c Immunization due - Flu vaccine HIGH DOSE PF (Fluzone High dose) Osteopenia, unspecified location dexa scheduled.  Vit d 1000u a day encouraged Encounter for preventive health examination Patient was encouraged to  exercise greater than 150 minutes a week. Patient was encouraged to choose a diet filled with fresh fruits and vegetables, and lean meats. AVS provided to patient today for education/recommendation on gender specific health and safety maintenance. Colonoscopy: Fhx in father. Cologuard neg 04/2016, rpt 3 years.   Mammogram: No Fhx. Last mammogram. 2015 bi rads 1 ([place]--> ordered today--> had to reschedule has it next week.  Cervical cancer screening: not indicated. Immunizations: flu shot provided today. tdap UTD 01/19/2016 provided, PNA series completed 2018. Shingrix script provided.  Infectious disease screening: hep c completed DEXA: 2015; osteopenia. Told to take calcium and vitamin D use. --> ordered today--> rescheduled for next week  Return in about 1 year (around 09/21/2019) for CPE. 6 mos [facility]  Electronically signed by: Howard Pouch, DO [place]

## 2018-09-20 NOTE — Patient Instructions (Addendum)
Health Maintenance, Female Adopting a healthy lifestyle and getting preventive care can go a long way to promote health and wellness. Talk with your health care provider about what schedule of regular examinations is right for you. This is a good chance for you to check in with your provider about disease prevention and staying healthy. In between checkups, there are plenty of things you can do on your own. Experts have done a lot of research about which lifestyle changes and preventive measures are most likely to keep you healthy. Ask your health care provider for more information. Weight and diet Eat a healthy diet  Be sure to include plenty of vegetables, fruits, low-fat dairy products, and lean protein.  Do not eat a lot of foods high in solid fats, added sugars, or salt.  Get regular exercise. This is one of the most important things you can do for your health. ? Most adults should exercise for at least 150 minutes each week. The exercise should increase your heart rate and make you sweat (moderate-intensity exercise). ? Most adults should also do strengthening exercises at least twice a week. This is in addition to the moderate-intensity exercise.  Maintain a healthy weight  Body mass index (BMI) is a measurement that can be used to identify possible weight problems. It estimates body fat based on height and weight. Your health care provider can help determine your BMI and help you achieve or maintain a healthy weight.  For females 20 years of age and older: ? A BMI below 18.5 is considered underweight. ? A BMI of 18.5 to 24.9 is normal. ? A BMI of 25 to 29.9 is considered overweight. ? A BMI of 30 and above is considered obese.  Watch levels of cholesterol and blood lipids  You should start having your blood tested for lipids and cholesterol at 72 years of age, then have this test every 5 years.  You may need to have your cholesterol levels checked more often if: ? Your lipid or  cholesterol levels are high. ? You are older than 72 years of age. ? You are at high risk for heart disease.  Cancer screening Lung Cancer  Lung cancer screening is recommended for adults 55-80 years old who are at high risk for lung cancer because of a history of smoking.  A yearly low-dose CT scan of the lungs is recommended for people who: ? Currently smoke. ? Have quit within the past 15 years. ? Have at least a 30-pack-year history of smoking. A pack year is smoking an average of one pack of cigarettes a day for 1 year.  Yearly screening should continue until it has been 15 years since you quit.  Yearly screening should stop if you develop a health problem that would prevent you from having lung cancer treatment.  Breast Cancer  Practice breast self-awareness. This means understanding how your breasts normally appear and feel.  It also means doing regular breast self-exams. Let your health care provider know about any changes, no matter how small.  If you are in your 20s or 30s, you should have a clinical breast exam (CBE) by a health care provider every 1-3 years as part of a regular health exam.  If you are 40 or older, have a CBE every year. Also consider having a breast X-ray (mammogram) every year.  If you have a family history of breast cancer, talk to your health care provider about genetic screening.  If you are at high risk   for breast cancer, talk to your health care provider about having an MRI and a mammogram every year.  Breast cancer gene (BRCA) assessment is recommended for women who have family members with BRCA-related cancers. BRCA-related cancers include: ? Breast. ? Ovarian. ? Tubal. ? Peritoneal cancers.  Results of the assessment will determine the need for genetic counseling and BRCA1 and BRCA2 testing.  Cervical Cancer Your health care provider may recommend that you be screened regularly for cancer of the pelvic organs (ovaries, uterus, and  vagina). This screening involves a pelvic examination, including checking for microscopic changes to the surface of your cervix (Pap test). You may be encouraged to have this screening done every 3 years, beginning at age 22.  For women ages 56-65, health care providers may recommend pelvic exams and Pap testing every 3 years, or they may recommend the Pap and pelvic exam, combined with testing for human papilloma virus (HPV), every 5 years. Some types of HPV increase your risk of cervical cancer. Testing for HPV may also be done on women of any age with unclear Pap test results.  Other health care providers may not recommend any screening for nonpregnant women who are considered low risk for pelvic cancer and who do not have symptoms. Ask your health care provider if a screening pelvic exam is right for you.  If you have had past treatment for cervical cancer or a condition that could lead to cancer, you need Pap tests and screening for cancer for at least 20 years after your treatment. If Pap tests have been discontinued, your risk factors (such as having a new sexual partner) need to be reassessed to determine if screening should resume. Some women have medical problems that increase the chance of getting cervical cancer. In these cases, your health care provider may recommend more frequent screening and Pap tests.  Colorectal Cancer  This type of cancer can be detected and often prevented.  Routine colorectal cancer screening usually begins at 72 years of age and continues through 72 years of age.  Your health care provider may recommend screening at an earlier age if you have risk factors for colon cancer.  Your health care provider may also recommend using home test kits to check for hidden blood in the stool.  A small camera at the end of a tube can be used to examine your colon directly (sigmoidoscopy or colonoscopy). This is done to check for the earliest forms of colorectal  cancer.  Routine screening usually begins at age 33.  Direct examination of the colon should be repeated every 5-10 years through 72 years of age. However, you may need to be screened more often if early forms of precancerous polyps or small growths are found.  Skin Cancer  Check your skin from head to toe regularly.  Tell your health care provider about any new moles or changes in moles, especially if there is a change in a mole's shape or color.  Also tell your health care provider if you have a mole that is larger than the size of a pencil eraser.  Always use sunscreen. Apply sunscreen liberally and repeatedly throughout the day.  Protect yourself by wearing long sleeves, pants, a wide-brimmed hat, and sunglasses whenever you are outside.  Heart disease, diabetes, and high blood pressure  High blood pressure causes heart disease and increases the risk of stroke. High blood pressure is more likely to develop in: ? People who have blood pressure in the high end of  the normal range (130-139/85-89 mm Hg). ? People who are overweight or obese. ? People who are African American.  If you are 21-29 years of age, have your blood pressure checked every 3-5 years. If you are 3 years of age or older, have your blood pressure checked every year. You should have your blood pressure measured twice-once when you are at a hospital or clinic, and once when you are not at a hospital or clinic. Record the average of the two measurements. To check your blood pressure when you are not at a hospital or clinic, you can use: ? An automated blood pressure machine at a pharmacy. ? A home blood pressure monitor.  If you are between 17 years and 37 years old, ask your health care provider if you should take aspirin to prevent strokes.  Have regular diabetes screenings. This involves taking a blood sample to check your fasting blood sugar level. ? If you are at a normal weight and have a low risk for diabetes,  have this test once every three years after 72 years of age. ? If you are overweight and have a high risk for diabetes, consider being tested at a younger age or more often. Preventing infection Hepatitis B  If you have a higher risk for hepatitis B, you should be screened for this virus. You are considered at high risk for hepatitis B if: ? You were born in a country where hepatitis B is common. Ask your health care provider which countries are considered high risk. ? Your parents were born in a high-risk country, and you have not been immunized against hepatitis B (hepatitis B vaccine). ? You have HIV or AIDS. ? You use needles to inject street drugs. ? You live with someone who has hepatitis B. ? You have had sex with someone who has hepatitis B. ? You get hemodialysis treatment. ? You take certain medicines for conditions, including cancer, organ transplantation, and autoimmune conditions.  Hepatitis C  Blood testing is recommended for: ? Everyone born from [birth year] through 1965. ? Anyone with known risk factors for hepatitis C.  Sexually transmitted infections (STIs)  You should be screened for sexually transmitted infections (STIs) including gonorrhea and chlamydia if: ? You are sexually active and are younger than 72 years of age. ? You are older than 72 years of age and your health care provider tells you that you are at risk for this type of infection. ? Your sexual activity has changed since you were last screened and you are at an increased risk for chlamydia or gonorrhea. Ask your health care provider if you are at risk.  If you do not have HIV, but are at risk, it may be recommended that you take a prescription medicine daily to prevent HIV infection. This is called pre-exposure prophylaxis (PrEP). You are considered at risk if: ? You are sexually active and do not regularly use condoms or know the HIV status of your partner(s). ? You take drugs by injection. ? You are  sexually active with a partner who has HIV.  Talk with your health care provider about whether you are at high risk of being infected with HIV. If you choose to begin PrEP, you should first be tested for HIV. You should then be tested every 3 months for as long as you are taking PrEP. Pregnancy  If you are premenopausal and you may become pregnant, ask your health care provider about preconception counseling.  If you may become  pregnant, take 400 to 800 micrograms (mcg) of folic acid every day.  If you want to prevent pregnancy, talk to your health care provider about birth control (contraception). Osteoporosis and menopause  Osteoporosis is a disease in which the bones lose minerals and strength with aging. This can result in serious bone fractures. Your risk for osteoporosis can be identified using a bone density scan.  If you are 65 years of age or older, or if you are at risk for osteoporosis and fractures, ask your health care provider if you should be screened.  Ask your health care provider whether you should take a calcium or vitamin D supplement to lower your risk for osteoporosis.  Menopause may have certain physical symptoms and risks.  Hormone replacement therapy may reduce some of these symptoms and risks. Talk to your health care provider about whether hormone replacement therapy is right for you. Follow these instructions at home:  Schedule regular health, dental, and eye exams.  Stay current with your immunizations.  Do not use any tobacco products including cigarettes, chewing tobacco, or electronic cigarettes.  If you are pregnant, do not drink alcohol.  If you are breastfeeding, limit how much and how often you drink alcohol.  Limit alcohol intake to no more than 1 drink per day for nonpregnant women. One drink equals 12 ounces of beer, 5 ounces of wine, or 1 ounces of hard liquor.  Do not use street drugs.  Do not share needles.  Ask your health care  provider for help if you need support or information about quitting drugs.  Tell your health care provider if you often feel depressed.  Tell your health care provider if you have ever been abused or do not feel safe at home. This information is not intended to replace advice given to you by your health care provider. Make sure you discuss any questions you have with your health care provider. Document Released: 05/16/2011 Document Revised: 04/07/2016 Document Reviewed: 08/04/2015 Elsevier Interactive Patient Education  2018 [employer]  Please help us help you:  We are honored you have chosen [facility] for your Primary Care home. Below you will find basic instructions that you may need to access in the future. Please help us help you by reading the instructions, which cover many of the frequent questions we experience.   Prescription refills and request:  -In order to allow more efficient response time, please call your pharmacy for all refills. They will forward the request electronically to us. This allows for the quickest possible response. Request left on a nurse line can take longer to refill, since these are checked as time allows between office patients and other phone calls.  - refill request can take up to 3-5 working days to complete.  - If request is sent electronically and request is appropiate, it is usually completed in 1-2 business days.  - all patients will need to be seen routinely for all chronic medical conditions requiring prescription medications (see follow-up below). If you are overdue for follow up on your condition, you will be asked to make an appointment and we will call in enough medication to cover you until your appointment (up to 30 days).  - all controlled substances will require a face to face visit to request/refill.  - if you desire your prescriptions to go through a new pharmacy, and have an active script at original pharmacy, you will need to call  your pharmacy and have scripts transferred to new   pharmacy. This is completed between the pharmacy locations and not by your provider.    Results: If any images or labs were ordered, it can take up to 1 week to get results depending on the test ordered and the lab/facility running and resulting the test. - Normal or stable results, which do not need further discussion, may be released to your mychart immediately with attached note to you. A call may not be generated for normal results. Please make certain to sign up for mychart. If you have questions on how to activate your mychart you can call the front office.  - If your results need further discussion, our office will attempt to contact you via phone, and if unable to reach you after 2 attempts, we will release your abnormal result to your mychart with instructions.  - All results will be automatically released in mychart after 1 week.  - Your provider will provide you with explanation and instruction on all relevant material in your results. Please keep in mind, results and labs may appear confusing or abnormal to the untrained eye, but it does not mean they are actually abnormal for you personally. If you have any questions about your results that are not covered, or you desire more detailed explanation than what was provided, you should make an appointment with your provider to do so.   Our office handles many outgoing and incoming calls daily. If we have not contacted you within 1 week about your results, please check your mychart to see if there is a message first and if not, then contact our office.  In helping with this matter, you help decrease call volume, and therefore allow us to be able to respond to patients needs more efficiently.   Acute office visits (sick visit):  An acute visit is intended for a new problem and are scheduled in shorter time slots to allow schedule openings for patients with new problems. This is the appropriate visit  to discuss a new problem. Problems will not be addressed by phone call or Echart message. Appointment is needed if requesting treatment. In order to provide you with excellent quality medical care with proper time for you to explain your problem, have an exam and receive treatment with instructions, these appointments should be limited to one new problem per visit. If you experience a new problem, in which you desire to be addressed, please make an acute office visit, we save openings on the schedule to accommodate you. Please do not save your new problem for any other type of visit, let us take care of it properly and quickly for you.   Follow up visits:  Depending on your condition(s) your provider will need to see you routinely in order to provide you with quality care and prescribe medication(s). Most chronic conditions (Example: hypertension, Diabetes, depression/anxiety... etc), require visits a couple times a year. Your provider will instruct you on proper follow up for your personal medical conditions and history. Please make certain to make follow up appointments for your condition as instructed. Failing to do so could result in lapse in your medication treatment/refills. If you request a refill, and are overdue to be seen on a condition, we will always provide you with a 30 day script (once) to allow you time to schedule.    Medicare wellness (well visit): - we have a wonderful Nurse (Kim), that will meet with you and provide you will yearly medicare wellness visits. These visits should occur yearly (can not   be scheduled less than 1 calendar year apart) and cover preventive health, immunizations, advance directives and screenings you are entitled to yearly through your medicare benefits. Do not miss out on your entitled benefits, this is when medicare will pay for these benefits to be ordered for you.  These are strongly encouraged by your provider and is the appropriate type of visit to make  certain you are up to date with all preventive health benefits. If you have not had your medicare wellness exam in the last 12 months, please make certain to schedule one by calling the office and schedule your medicare wellness with Kim as soon as possible.   Yearly physical (well visit):  - Adults are recommended to be seen yearly for physicals. Check with your insurance and date of your last physical, most insurances require one calendar year between physicals. Physicals include all preventive health topics, screenings, medical exam and labs that are appropriate for gender/age and history. You may have fasting labs needed at this visit. This is a well visit (not a sick visit), new problems should not be covered during this visit (see acute visit).  - Pediatric patients are seen more frequently when they are younger. Your provider will advise you on well child visit timing that is appropriate for your their age. - This is not a medicare wellness visit. Medicare wellness exams do not have an exam portion to the visit. Some medicare companies allow for a physical, some do not allow a yearly physical. If your medicare allows a yearly physical you can schedule the medicare wellness with our nurse Kim and have your physical with your provider after, on the same day. Please check with insurance for your full benefits.   Late Policy/No Shows:  - all new patients should arrive 15-30 minutes earlier than appointment to allow us time  to  obtain all personal demographics,  insurance information and for you to complete office paperwork. - All established patients should arrive 10-15 minutes earlier than appointment time to update all information and be checked in .  - In our best efforts to run on time, if you are late for your appointment you will be asked to either reschedule or if able, we will work you back into the schedule. There will be a wait time to work you back in the schedule,  depending on  availability.  - If you are unable to make it to your appointment as scheduled, please call 24 hours ahead of time to allow us to fill the time slot with someone else who needs to be seen. If you do not cancel your appointment ahead of time, you may be charged a no show fee.   

## 2018-10-09 ENCOUNTER — Ambulatory Visit
Admission: RE | Admit: 2018-10-09 | Discharge: 2018-10-09 | Disposition: A | Discharge status: Home / Self Care | Payer: PPO | Source: Ambulatory Visit | Attending: Family Medicine | Admitting: Family Medicine

## 2018-10-09 ENCOUNTER — Encounter: Payer: Self-pay | Admitting: Family Medicine

## 2018-10-09 ENCOUNTER — Telehealth: Payer: Self-pay | Admitting: Family Medicine

## 2018-10-09 DIAGNOSIS — M810 Age-related osteoporosis without current pathological fracture: Secondary | ICD-10-CM | POA: Diagnosis not present

## 2018-10-09 DIAGNOSIS — M8588 Other specified disorders of bone density and structure, other site: Secondary | ICD-10-CM | POA: Diagnosis not present

## 2018-10-09 DIAGNOSIS — Z1231 Encounter for screening mammogram for malignant neoplasm of breast: Secondary | ICD-10-CM | POA: Diagnosis not present

## 2018-10-09 DIAGNOSIS — Z1239 Encounter for other screening for malignant neoplasm of breast: Secondary | ICD-10-CM

## 2018-10-09 DIAGNOSIS — M8580 Other specified disorders of bone density and structure, unspecified site: Secondary | ICD-10-CM

## 2018-10-09 DIAGNOSIS — Z780 Asymptomatic menopausal state: Secondary | ICD-10-CM | POA: Diagnosis not present

## 2018-10-09 NOTE — Telephone Encounter (Signed)
Please inform patient the following information: Mammogram is normal.  Bone density resulted with worsening bone density meeting criteria for osteoporosis in her right hip/femur area.  - maintaining optimal levels of calcium 1200 mg Qd and vit d 1000u QD, along with weightbearing exercise is helpful.  - starting a medication, like fosamax once weekly, can also stop progression. If she is agreeable to try this medicine- will prescribed for her. Must be taken on an empty stomach, with full glass of water, once weekly- and remain upright (no laying down after taking) for 30 minutes.

## 2018-10-10 NOTE — Telephone Encounter (Signed)
Detailed message left on voice mail, okay per DPR. Patient instructed to call back if wanting to start Fosamax.

## 2018-10-30 ENCOUNTER — Ambulatory Visit: Payer: PPO | Admitting: Family Medicine

## 2018-12-12 ENCOUNTER — Other Ambulatory Visit: Payer: Self-pay | Admitting: Family Medicine

## 2018-12-12 DIAGNOSIS — I10 Essential (primary) hypertension: Secondary | ICD-10-CM

## 2018-12-12 NOTE — Telephone Encounter (Signed)
Pharmacy does reflect correct refills.

## 2018-12-12 NOTE — Telephone Encounter (Signed)
I received refill request for patient's lisinopril and amlodipine.  It looks like it was last refilled by the prescription request in October.  Patient was seen early November and at her November visit with prescriptions were filled for 90 days with an additional refill to [facility]. Please advise patient she should have refills and if this was a pharmacy generated request please call her pharmacy and advised them they should have a prescription for both of those there, that were called in September 20, 2018 for 90 days plus an additional refill.

## 2019-03-25 ENCOUNTER — Ambulatory Visit: Payer: PPO | Admitting: Family Medicine

## 2019-03-27 ENCOUNTER — Other Ambulatory Visit: Payer: Self-pay

## 2019-03-27 ENCOUNTER — Encounter: Payer: Self-pay | Admitting: Family Medicine

## 2019-03-27 ENCOUNTER — Ambulatory Visit (INDEPENDENT_AMBULATORY_CARE_PROVIDER_SITE_OTHER): Payer: PPO | Admitting: Family Medicine

## 2019-03-27 VITALS — Temp 98.7°F | Ht 61.5 in

## 2019-03-27 DIAGNOSIS — E669 Obesity, unspecified: Secondary | ICD-10-CM

## 2019-03-27 DIAGNOSIS — I10 Essential (primary) hypertension: Secondary | ICD-10-CM

## 2019-03-27 MED ORDER — AMLODIPINE BESYLATE 10 MG PO TABS: ORAL_TABLET | ORAL | 1 refills | Status: DC

## 2019-03-27 MED ORDER — LISINOPRIL 40 MG PO TABS: ORAL_TABLET | ORAL | 1 refills | Status: DC

## 2019-03-27 NOTE — Progress Notes (Signed)
VIRTUAL VISIT VIA VIDEO  I connected with Meghan Vang on 03/27/19 at  9:00 AM EDT by a video enabled telemedicine application and verified that I am speaking with the correct person using two identifiers. Location patient: Home Location provider: [facility], Office Persons participating in the virtual visit: Patient, Dr. Raoul Pitch and R.Baker, LPN  I discussed the limitations of evaluation and management by telemedicine and the availability of in person appointments. The patient expressed understanding and agreed to proceed.   SUBJECTIVE Chief Complaint  Patient presents with  . Hypertension    No complaints. Pt unable to take vital signs at home.     HPI:  Hypertension/obesity: Pt reports compliance withlisinopril 40 and amlodipine 10. Blood pressures ranges at home not routinely checked. Patient denies chest pain, shortness of breath, dizziness or lower extremity edema.  Ptdoes not takedaily baby ASA. Pt is notprescribed statin. Diet:low sodium Exercise:routinely CBC: 09/20/2018 within normal limits CMP: 09/20/2018 within normal limits Lipids: 09/20/2018 within normal limits TSH: 09/20/2018 within normal limits RF:HTN, Fhx heart disease and stroke, Morbid obesity ROS: See pertinent positives and negatives per HPI.  Depression screen [facility] 2/9 09/20/2018 09/19/2017 03/17/2017 08/16/2016  Decreased Interest 0 0 0 0  Down, Depressed, Hopeless 0 0 0 0  PHQ - 2 Score 0 0 0 0  Altered sleeping 0 - - -  Tired, decreased energy 0 - - -  Change in appetite 0 - - -  Feeling bad or failure about yourself  0 - - -  Trouble concentrating 0 - - -  Suicidal thoughts 0 - - -  PHQ-9 Score 0 - - -  Difficult doing work/chores Not difficult at all - - -    Patient Active Problem List   Diagnosis Date Noted  . Elevated hemoglobin A1c 09/20/2018  . Osteoporosis 09/19/2017  . Morbid obesity ([place]) 03/17/2017  . Encounter for general adult medical examination with abnormal  findings 01/19/2016  . Essential hypertension, benign 01/18/2016    Social History   Tobacco Use  . Smoking status: Never Smoker  . Smokeless tobacco: Never Used  Substance Use Topics  . Alcohol use: No    Alcohol/week: 0.0 standard drinks    Current Outpatient Medications:  .  amLODipine (NORVASC) 10 MG tablet, TAKE (1) TABLET BY MOUTH ONCE DAILY., Disp: 90 tablet, Rfl: 1 .  lisinopril (PRINIVIL,ZESTRIL) 40 MG tablet, TAKE (1) TABLET BY MOUTH ONCE DAILY., Disp: 90 tablet, Rfl: 1  Allergies  Allergen Reactions  . Bactrim [Sulfamethoxazole-Trimethoprim]     OBJECTIVE: Temp 98.7 F (37.1 C) (Oral)   Ht 5' 1.5" (1.562 m)   BMI 36.99 kg/m  Gen: No acute distress. Nontoxic in appearance. obese HENT: AT. [place].  MMM.  Eyes:Pupils Equal Round Reactive to light, Extraocular movements intact,  Conjunctiva without redness, discharge or icterus. CV: No edema Chest: Cough or shortness of breath not present Neuro: Normal gait. Alert. Oriented x3  Psych: Normal affect, dress and demeanor. Normal speech. Normal thought content and judgment.  ASSESSMENT AND PLAN: Meghan Vang is a 73 y.o. female present for  Essential hypertension, benign/obesity  stable BP check scheduled.  Continue amlodipine 10 and lisinopril 40 at current doses-refills provided.  - continue to watch diet and remain active.  - amLODipine (NORVASC) 10 MG tablet; TAKE (1) TABLET BY MOUTH ONCE DAILY.  Dispense: 90 tablet; Refill: 1 - lisinopril (PRINIVIL,ZESTRIL) 40 MG tablet; TAKE (1) TABLET BY MOUTH ONCE DAILY.  Dispense: 90 tablet; Refill: 1 - f/u 6  months (CPE already scheduled)  > 15 minutes spent with patient, >50% of time spent face to face counseling   Howard Pouch, DO 03/27/2019

## 2019-04-02 ENCOUNTER — Encounter: Payer: Self-pay | Admitting: Family Medicine

## 2019-04-02 ENCOUNTER — Ambulatory Visit (INDEPENDENT_AMBULATORY_CARE_PROVIDER_SITE_OTHER): Payer: PPO | Admitting: Family Medicine

## 2019-04-02 ENCOUNTER — Other Ambulatory Visit: Payer: Self-pay

## 2019-04-02 VITALS — BP 138/86 | HR 52

## 2019-04-02 DIAGNOSIS — I10 Essential (primary) hypertension: Secondary | ICD-10-CM

## 2019-04-02 MED ORDER — HYDROCHLOROTHIAZIDE 12.5 MG PO TABS: 12.5000 mg | ORAL_TABLET | Freq: Every day | ORAL | 1 refills | Status: DC

## 2019-04-02 NOTE — Progress Notes (Addendum)
Meghan Vang is a 73 y.o. female presents to the office today for Blood pressure recheck secondary to verbal orders and doxy.me visit Blood pressure medication: lisinopril 40mg  and amlodipine 10mg   If on medication, Last dose was at least 1-2 hours prior to recheck: No- patient takes medications at bedtime.   Blood pressure was taken in the left arm after patient rested for 10 minutes.  BP 138/86 (BP Location: Left Arm, Patient Position: Sitting, Cuff Size: Normal)   Pulse (!) 52   Lisa A., CMA ________________________________________________ Her BP is borderline high on both systolic and diastolic readings.  I have called in a mild diuretic (water pill), that will help bring levels down in to normal range.  Start daily - take in the morning though, may cause her to urinate more frequently and would not encourage that to occur before bed.  F/U  At next scheduled appt in [place].   Electronically Signed by: Howard Pouch, DO [place] [facility] [place]

## 2019-04-02 NOTE — Addendum Note (Signed)
Addended by: Howard Pouch A on: 04/02/2019 11:23 AM   Modules accepted: Orders, Level of Service

## 2019-04-02 NOTE — Progress Notes (Signed)
Patient advised, voiced understanding.

## 2019-04-03 ENCOUNTER — Ambulatory Visit: Payer: PPO

## 2019-06-09 ENCOUNTER — Other Ambulatory Visit: Payer: Self-pay | Admitting: Family Medicine

## 2019-06-09 DIAGNOSIS — I10 Essential (primary) hypertension: Secondary | ICD-10-CM

## 2019-08-12 ENCOUNTER — Ambulatory Visit (INDEPENDENT_AMBULATORY_CARE_PROVIDER_SITE_OTHER): Payer: PPO

## 2019-08-12 ENCOUNTER — Other Ambulatory Visit: Payer: Self-pay

## 2019-08-12 DIAGNOSIS — Z23 Encounter for immunization: Secondary | ICD-10-CM

## 2019-09-23 ENCOUNTER — Other Ambulatory Visit: Payer: Self-pay

## 2019-09-23 ENCOUNTER — Encounter: Payer: Self-pay | Admitting: Family Medicine

## 2019-09-23 ENCOUNTER — Telehealth: Payer: Self-pay | Admitting: Family Medicine

## 2019-09-23 ENCOUNTER — Ambulatory Visit (INDEPENDENT_AMBULATORY_CARE_PROVIDER_SITE_OTHER): Payer: PPO | Admitting: Family Medicine

## 2019-09-23 VITALS — BP 121/80 | HR 86 | Temp 98.6°F | Resp 16 | Ht 61.42 in | Wt 200.4 lb

## 2019-09-23 DIAGNOSIS — I10 Essential (primary) hypertension: Secondary | ICD-10-CM | POA: Diagnosis not present

## 2019-09-23 DIAGNOSIS — Z1231 Encounter for screening mammogram for malignant neoplasm of breast: Secondary | ICD-10-CM | POA: Diagnosis not present

## 2019-09-23 DIAGNOSIS — Z1211 Encounter for screening for malignant neoplasm of colon: Secondary | ICD-10-CM

## 2019-09-23 DIAGNOSIS — E669 Obesity, unspecified: Secondary | ICD-10-CM | POA: Diagnosis not present

## 2019-09-23 DIAGNOSIS — M810 Age-related osteoporosis without current pathological fracture: Secondary | ICD-10-CM | POA: Diagnosis not present

## 2019-09-23 DIAGNOSIS — R7309 Other abnormal glucose: Secondary | ICD-10-CM

## 2019-09-23 DIAGNOSIS — Z0000 Encounter for general adult medical examination without abnormal findings: Secondary | ICD-10-CM | POA: Diagnosis not present

## 2019-09-23 LAB — COMPREHENSIVE METABOLIC PANEL
ALT: 10 U/L (ref 0–35)
AST: 12 U/L (ref 0–37)
Albumin: 4.2 g/dL (ref 3.5–5.2)
Alkaline Phosphatase: 62 U/L (ref 39–117)
BUN: 17 mg/dL (ref 6–23)
CO2: 27 mEq/L (ref 19–32)
Calcium: 9 mg/dL (ref 8.4–10.5)
Chloride: 105 mEq/L (ref 96–112)
Creatinine, Ser: 0.79 mg/dL (ref 0.40–1.20)
GFR: 71.25 mL/min (ref >60.00)
Glucose, Bld: 110 mg/dL — ABNORMAL HIGH (ref 70–99)
Potassium: 4.6 mEq/L (ref 3.5–5.1)
Sodium: 139 mEq/L (ref 135–145)
Total Bilirubin: 0.4 mg/dL (ref 0.2–1.2)
Total Protein: 6.8 g/dL (ref 6.0–8.3)

## 2019-09-23 LAB — TSH: TSH: 1.44 u[IU]/mL (ref 0.35–4.50)

## 2019-09-23 LAB — LIPID PANEL
Cholesterol: 154 mg/dL (ref 0–200)
HDL: 58.7 mg/dL (ref >39.00)
LDL Cholesterol: 70 mg/dL (ref 0–99)
NonHDL: 95
Total CHOL/HDL Ratio: 3
Triglycerides: 125 mg/dL (ref 0.0–149.0)
VLDL: 25 mg/dL (ref 0.0–40.0)

## 2019-09-23 LAB — CBC WITH DIFFERENTIAL/PLATELET
Basophils Absolute: 0 10*3/uL (ref 0.0–0.1)
Basophils Relative: 0.6 % (ref 0.0–3.0)
Eosinophils Absolute: 0.1 10*3/uL (ref 0.0–0.7)
Eosinophils Relative: 1.6 % (ref 0.0–5.0)
HCT: 41.4 % (ref 36.0–46.0)
Hemoglobin: 13.6 g/dL (ref 12.0–15.0)
Lymphocytes Relative: 34.5 % (ref 12.0–46.0)
Lymphs Abs: 2.5 10*3/uL (ref 0.7–4.0)
MCHC: 32.9 g/dL (ref 30.0–36.0)
MCV: 92.5 fl (ref 78.0–100.0)
Monocytes Absolute: 0.5 10*3/uL (ref 0.1–1.0)
Monocytes Relative: 7.1 % (ref 3.0–12.0)
Neutro Abs: 4 10*3/uL (ref 1.4–7.7)
Neutrophils Relative %: 56.2 % (ref 43.0–77.0)
Platelets: 252 10*3/uL (ref 150.0–400.0)
RBC: 4.47 Mil/uL (ref 3.87–5.11)
RDW: 13.5 % (ref 11.5–15.5)
WBC: 7.2 10*3/uL (ref 4.0–10.5)

## 2019-09-23 LAB — HEMOGLOBIN A1C: Hgb A1c MFr Bld: 5.9 % (ref 4.6–6.5)

## 2019-09-23 LAB — VITAMIN D 25 HYDROXY (VIT D DEFICIENCY, FRACTURES): VITD: 8.41 ng/mL — ABNORMAL LOW (ref 30.00–100.00)

## 2019-09-23 MED ORDER — VITAMIN D (ERGOCALCIFEROL) 1.25 MG (50000 UNIT) PO CAPS: 50000.0000 [IU] | ORAL_CAPSULE | ORAL | 0 refills | Status: DC

## 2019-09-23 MED ORDER — ZOSTER VAC RECOMB ADJUVANTED 50 MCG/0.5ML IM SUSR: 0.5000 mL | Freq: Once | INTRAMUSCULAR | 1 refills | Status: AC

## 2019-09-23 MED ORDER — HYDROCHLOROTHIAZIDE 12.5 MG PO TABS: 12.5000 mg | ORAL_TABLET | Freq: Every day | ORAL | 1 refills | Status: DC

## 2019-09-23 MED ORDER — AMLODIPINE BESYLATE 10 MG PO TABS: ORAL_TABLET | ORAL | 1 refills | Status: DC

## 2019-09-23 MED ORDER — LISINOPRIL 40 MG PO TABS: ORAL_TABLET | ORAL | 1 refills | Status: DC

## 2019-09-23 NOTE — Patient Instructions (Signed)
Health Maintenance, Female Adopting a healthy lifestyle and getting preventive care are important in promoting health and wellness. Ask your health care provider about:  The right schedule for you to have regular tests and exams.  Things you can do on your own to prevent diseases and keep yourself healthy. What should I know about diet, weight, and exercise? Eat a healthy diet   Eat a diet that includes plenty of vegetables, fruits, low-fat dairy products, and lean protein.  Do not eat a lot of foods that are high in solid fats, added sugars, or sodium. Maintain a healthy weight Body mass index (BMI) is used to identify weight problems. It estimates body fat based on height and weight. Your health care provider can help determine your BMI and help you achieve or maintain a healthy weight. Get regular exercise Get regular exercise. This is one of the most important things you can do for your health. Most adults should:  Exercise for at least 150 minutes each week. The exercise should increase your heart rate and make you sweat (moderate-intensity exercise).  Do strengthening exercises at least twice a week. This is in addition to the moderate-intensity exercise.  Spend less time sitting. Even light physical activity can be beneficial. Watch cholesterol and blood lipids Have your blood tested for lipids and cholesterol at 73 years of age, then have this test every 5 years. Have your cholesterol levels checked more often if:  Your lipid or cholesterol levels are high.  You are older than 73 years of age.  You are at high risk for heart disease. What should I know about cancer screening? Depending on your health history and family history, you may need to have cancer screening at various ages. This may include screening for:  Breast cancer.  Cervical cancer.  Colorectal cancer.  Skin cancer.  Lung cancer. What should I know about heart disease, diabetes, and high blood  pressure? Blood pressure and heart disease  High blood pressure causes heart disease and increases the risk of stroke. This is more likely to develop in people who have high blood pressure readings, are of African descent, or are overweight.  Have your blood pressure checked: ? Every 3-5 years if you are 18-39 years of age. ? Every year if you are 40 years old or older. Diabetes Have regular diabetes screenings. This checks your fasting blood sugar level. Have the screening done:  Once every three years after age 40 if you are at a normal weight and have a low risk for diabetes.  More often and at a younger age if you are overweight or have a high risk for diabetes. What should I know about preventing infection? Hepatitis B If you have a higher risk for hepatitis B, you should be screened for this virus. Talk with your health care provider to find out if you are at risk for hepatitis B infection. Hepatitis C Testing is recommended for:  Everyone born from 1945 through 1965.  Anyone with known risk factors for hepatitis C. Sexually transmitted infections (STIs)  Get screened for STIs, including gonorrhea and chlamydia, if: ? You are sexually active and are younger than 73 years of age. ? You are older than 73 years of age and your health care provider tells you that you are at risk for this type of infection. ? Your sexual activity has changed since you were last screened, and you are at increased risk for chlamydia or gonorrhea. Ask your health care provider if   you are at risk.  Ask your health care provider about whether you are at high risk for HIV. Your health care provider may recommend a prescription medicine to help prevent HIV infection. If you choose to take medicine to prevent HIV, you should first get tested for HIV. You should then be tested every 3 months for as long as you are taking the medicine. Pregnancy  If you are about to stop having your period (premenopausal) and  you may become pregnant, seek counseling before you get pregnant.  Take 400 to 800 micrograms (mcg) of folic acid every day if you become pregnant.  Ask for birth control (contraception) if you want to prevent pregnancy. Osteoporosis and menopause Osteoporosis is a disease in which the bones lose minerals and strength with aging. This can result in bone fractures. If you are 65 years old or older, or if you are at risk for osteoporosis and fractures, ask your health care provider if you should:  Be screened for bone loss.  Take a calcium or vitamin D supplement to lower your risk of fractures.  Be given hormone replacement therapy (HRT) to treat symptoms of menopause. Follow these instructions at home: Lifestyle  Do not use any products that contain nicotine or tobacco, such as cigarettes, e-cigarettes, and chewing tobacco. If you need help quitting, ask your health care provider.  Do not use street drugs.  Do not share needles.  Ask your health care provider for help if you need support or information about quitting drugs. Alcohol use  Do not drink alcohol if: ? Your health care provider tells you not to drink. ? You are pregnant, may be pregnant, or are planning to become pregnant.  If you drink alcohol: ? Limit how much you use to 0-1 drink a day. ? Limit intake if you are breastfeeding.  Be aware of how much alcohol is in your drink. In the [place] one drink equals one 12 oz bottle of beer (355 mL), one 5 oz glass of wine (148 mL), or one 1 oz glass of hard liquor (44 mL). General instructions  Schedule regular health, dental, and eye exams.  Stay current with your vaccines.  Tell your health care provider if: ? You often feel depressed. ? You have ever been abused or do not feel safe at home. Summary  Adopting a healthy lifestyle and getting preventive care are important in promoting health and wellness.  Follow your health care provider's instructions about healthy  diet, exercising, and getting tested or screened for diseases.  Follow your health care provider's instructions on monitoring your cholesterol and blood pressure. This information is not intended to replace advice given to you by your health care provider. Make sure you discuss any questions you have with your health care provider. Document Released: 05/16/2011 Document Revised: 10/24/2018 Document Reviewed: 10/24/2018 Elsevier Patient Education  2020 Elsevier Inc.  

## 2019-09-23 NOTE — Telephone Encounter (Signed)
Called patient and lvm to call back to discuss test results

## 2019-09-23 NOTE — Progress Notes (Signed)
Patient ID: Meghan Vang, female  DOB: [birth year], 73 y.o.   MRN: [id number] Patient Care Team    Relationship Specialty Notifications Start End  Ma Hillock, DO PCP - General Family Medicine  01/19/16     Chief Complaint  Patient presents with  . Annual Exam    cologuard 04/14/2016. mamm 10/09/2018    Subjective:  Meghan Vang is a 73 y.o.  Female  present for CPE. All past medical history, surgical history, allergies, family history, immunizations, medications and social history were updated in the electronic medical record today. All recent labs, ED visits and hospitalizations within the last year were reviewed.  Health maintenance:  Colonoscopy: Fhx in father.Cologuard neg 04/2016- NL- ordered today Mammogram: No Fhx. Last mammogram. 09/2018>>ordered Cervical cancer screening: not indicated. Immunizations:flu shot UTD 2020. tdap UTD 01/19/2016 provided, PNA series completed 2018. Shingrix script provided today.  Infectious disease screening:hep c completed DEXA: 2019; osteoporosisTold to take calcium and vitamin D>> rpt 3 years Assistive device: none Oxygen [id number] Patient has a Dental home. Hospitalizations/ED visits: none  Hypertension/obesity: Pt reportscompliance withlisinopril 40 and amlodipine 10.Patient denies chest pain, shortness of breath, dizziness or lower extremity edema.  Ptdoes not takedaily baby ASA. Pt is notprescribed statin. Diet:low sodium Exercise:routinely CBC: 09/20/2018 within normal limits CMP: 09/20/2018 within normal limits Lipids: 09/20/2018 within normal limits TSH: 09/20/2018 within normal limits RF:HTN, Fhx heart disease and stroke, Morbid obesity ROS: See pertinent positives and negatives per HPI.  Depression screen [facility] 2/9 09/23/2019 09/20/2018 09/19/2017 03/17/2017 08/16/2016  Decreased Interest 0 0 0 0 0  Down, Depressed, Hopeless 0 0 0 0 0  PHQ - 2 Score 0 0 0 0 0  Altered sleeping - 0 - - -  Tired, decreased energy -  0 - - -  Change in appetite - 0 - - -  Feeling bad or failure about yourself  - 0 - - -  Trouble concentrating - 0 - - -  Suicidal thoughts - 0 - - -  PHQ-9 Score - 0 - - -  Difficult doing work/chores - Not difficult at all - - -   No flowsheet data found.   Immunization History  Administered Date(s) Administered  . Fluad Quad(high Dose 65+) 08/12/2019  . Influenza, High Dose Seasonal PF 09/20/2018  . Pneumococcal Conjugate-13 01/19/2016  . Pneumococcal Polysaccharide-23 09/19/2017  . Tdap 01/19/2016   Past Medical History:  Diagnosis Date  . Allergy   . Anemia   . Chicken pox   . Depression   . Hypertension   . Menopause    Allergies  Allergen Reactions  . Bactrim [Sulfamethoxazole-Trimethoprim]    Past Surgical History:  Procedure Laterality Date  . TUBAL LIGATION     Family History  Problem Relation Age of Onset  . Colon cancer Father 62  . Diabetes Father   . Heart disease Father   . Asthma Mother   . Diabetes Mother   . Heart disease Mother   . Asthma Brother   . Heart disease Brother   . Liver cancer Maternal Grandmother   . Heart disease Maternal Grandfather   . Heart disease Paternal Grandmother   . Stroke Paternal Grandfather    Social History   Social History Narrative   Divorced. 2 sons, 1 daughter. Meghan Vang, Meghan Vang).   Some college. Retired. Moved from [place] to be next to family.    Drinks caffeine.    Wears her seatbelt and bike helmet.    Smoke detector  in the home.    Feels safe in her relationships.     Allergies as of 09/23/2019      Reactions   Bactrim [sulfamethoxazole-trimethoprim]       Medication List       Accurate as of September 23, 2019  8:34 AM. If you have any questions, ask your nurse or doctor.        amLODipine 10 MG tablet Commonly known as: NORVASC TAKE (1) TABLET BY MOUTH ONCE DAILY.   hydrochlorothiazide 12.5 MG tablet Commonly known as: HYDRODIURIL Take 1 tablet (12.5 mg total) by mouth daily.    lisinopril 40 MG tablet Commonly known as: ZESTRIL TAKE (1) TABLET BY MOUTH ONCE DAILY.   Zoster Vaccine Adjuvanted injection Commonly known as: SHINGRIX Inject 0.5 mLs into the muscle once for 1 dose. Rpt in 2-6 months. Started by: Howard Pouch, DO       All past medical history, surgical history, allergies, family history, immunizations andmedications were updated in the EMR today and reviewed under the history and medication portions of their EMR.     No results found for this or any previous visit (from the past 2160 hour(s)).    Mm 3d Screen Breast Bilateral  Result Date: 10/09/2018 CLINICAL DATA:  Screening. EXAM: DIGITAL SCREENING BILATERAL MAMMOGRAM WITH TOMO AND CAD COMPARISON:  Previous exam(s). ACR Breast Density Category b: There are scattered areas of fibroglandular density. FINDINGS: There are no findings suspicious for malignancy. Images were processed with CAD. IMPRESSION: No mammographic evidence of malignancy. A result letter of this screening mammogram will be mailed directly to the patient. RECOMMENDATION: Screening mammogram in one year. (Code:SM-B-01Y) BI-RADS CATEGORY  1: Negative. Electronically Signed   By: Kristopher Oppenheim M.D.   On: 10/09/2018 16:51     ROS: 14 pt review of systems performed and negative (unless mentioned in an HPI)  Objective: BP 121/80 (BP Location: Left Arm, Patient Position: Sitting, Cuff Size: Large)   Pulse 86   Temp 98.6 F (37 C) (Temporal)   Resp 16   Ht 5' 1.42" (1.56 m)   Wt 200 lb 6 oz (90.9 kg)   SpO2 99%   BMI 37.35 kg/m  Gen: Afebrile. No acute distress. Nontoxic in appearance, well-developed, well-nourished,  Very pleasant, caucasian female. Obese.  HENT: AT. [place]. Bilateral TM visualized and normal in appearance, normal external auditory canal. MMM, no oral lesions, adequate dentition. Bilateral nares within normal limits. Throat without erythema, ulcerations or exudates. no Cough on exam, no hoarseness on exam.  Eyes:Pupils Equal Round Reactive to light, Extraocular movements intact,  Conjunctiva without redness, discharge or icterus. Neck/lymp/endocrine: Supple,no lymphadenopathy, no thyromegaly CV: RRR no murmur, no edema, +2/4 P posterior tibialis pulses. no carotid bruits. No JVD. Chest: CTAB, no wheeze, rhonchi or crackles. normal Respiratory effort. good Air movement. Abd: Soft. obese. NTND. BS present. no Masses palpated. No hepatosplenomegaly. No rebound tenderness or guarding. Skin: no rashes, purpura or petechiae. Warm and well-perfused. Skin intact. Neuro/Msk:  Normal gait. PERLA. EOMi. Alert. Oriented x3.  Cranial nerves II through XII intact. Muscle strength 5/5 upper/lower extremity. DTRs equal bilaterally. Psych: Normal affect, dress and demeanor. Normal speech. Normal thought content and judgment.   No exam data present  Assessment/plan: Meghan Vang is a 73 y.o. female present for CPE Essential hypertension, benign/obesity  - Stable.   Continue amlodipine 10, lisinopril 40 and HCTZ 12.5 mg. - continue to watch diet and remain active.  - low sodium diet. Routine exercise.  - amLODipine (  NORVASC) 10 MG tablet; TAKE (1) TABLET BY MOUTH ONCE DAILY. Dispense: 90 tablet; Refill: 1 - lisinopril (PRINIVIL,ZESTRIL) 40 MG tablet; TAKE (1) TABLET BY MOUTH ONCE DAILY. Dispense: 90 tablet; Refill: 1 - f/u 6 months (can be virtual) Osteoporosis without current pathological fracture, unspecified osteoporosis type Encourage to take 1000 u daily of vit. D - Vitamin D (25 hydroxy) Elevated hemoglobin A1c - HgB A1c Colon cancer screening - Cologuard Breast cancer screening by mammogram - MM 3D SCREEN BREAST BILATERAL; Future Routine general medical examination at a health care facility Patient was encouraged to exercise greater than 150 minutes a week. Patient was encouraged to choose a diet filled with fresh fruits and vegetables, and lean meats. AVS provided to patient today for  education/recommendation on gender Colonoscopy: Fhx in father.Cologuard neg 04/2016- NL- ordered today Mammogram: No Fhx. Last mammogram. 09/2018>>ordered Cervical cancer screening: not indicated. Immunizations:flu shot UTD 2020. tdap UTD 01/19/2016 provided, PNA series completed 2018. Shingrix script provided today.  Infectious disease screening:hep c completed DEXA: 2019; osteoporosisTold to take calcium and vitamin D>> rpt 3 yearsecific health and safety maintenance.  Return in about 6 months (around 03/22/2020) for [place] (30 min). 1 year CPE  Electronically signed by: Howard Pouch, DO [place]

## 2019-09-23 NOTE — Telephone Encounter (Signed)
Please inform patient the following information: Her vitamin D results are drastically low at 8.  Normal range is between 30-50.  I have called in a once weekly vitamin D dose to be taken with food-for 12 weeks.  Follow-up in 12 weeks with provider to retest and see if levels are within normal range or she needs additional medication. Her liver, kidneys, cholesterol, blood counts and thyroid are all within normal range. Her diabetes screen is a little elevated at 5.9, with a glucose of 110, this puts her still in the "prediabetes "range.  No medications are needed at this time however would recommend a mediterranean diet and regular exercise.  A mediterranean diet is high in fruits, vegetables, whole grains, fish, chicken, nuts, healthy fats (olive oil or canola oil). Low fat dairy. There are many online resources and books on this diet. Limit butter, margarine, red meat and sweets.

## 2019-09-24 ENCOUNTER — Telehealth: Payer: Self-pay | Admitting: Family Medicine

## 2019-09-24 NOTE — Telephone Encounter (Signed)
Pt returned call and was given all lab results/instructions. Pt was scheduled for 12 week F/U.

## 2019-09-24 NOTE — Telephone Encounter (Signed)
Pt was called and VM was left to return call. See other phone note.

## 2019-09-24 NOTE — Telephone Encounter (Signed)
Returning call from yesterday afternoon. Patient stated she received a call from our office, probably regarding test results.  Please call patient at [phone]

## 2019-09-24 NOTE — Telephone Encounter (Signed)
Pt was called and VM was left to return call  °

## 2019-10-25 ENCOUNTER — Other Ambulatory Visit: Payer: Self-pay | Admitting: Family Medicine

## 2019-12-09 ENCOUNTER — Other Ambulatory Visit: Payer: Self-pay | Admitting: Family Medicine

## 2019-12-09 DIAGNOSIS — I10 Essential (primary) hypertension: Secondary | ICD-10-CM

## 2019-12-25 ENCOUNTER — Other Ambulatory Visit: Payer: Self-pay

## 2019-12-25 ENCOUNTER — Ambulatory Visit (INDEPENDENT_AMBULATORY_CARE_PROVIDER_SITE_OTHER): Payer: PPO | Admitting: Family Medicine

## 2019-12-25 ENCOUNTER — Encounter: Payer: Self-pay | Admitting: Family Medicine

## 2019-12-25 VITALS — BP 132/79 | HR 86 | Temp 97.2°F | Resp 16 | Ht 61.0 in | Wt 200.1 lb

## 2019-12-25 DIAGNOSIS — M810 Age-related osteoporosis without current pathological fracture: Secondary | ICD-10-CM

## 2019-12-25 DIAGNOSIS — E559 Vitamin D deficiency, unspecified: Secondary | ICD-10-CM | POA: Insufficient documentation

## 2019-12-25 NOTE — Patient Instructions (Addendum)
Vitamin D Deficiency Vitamin D deficiency is when your body does not have enough vitamin D. Vitamin D is important to your body for many reasons:  It helps the body absorb two important minerals--calcium and phosphorus.  It plays a role in bone health.  It may help to prevent some diseases, such as diabetes and multiple sclerosis.  It plays a role in muscle function, including heart function. If vitamin D deficiency is severe, it can cause a condition in which your bones become soft. In adults, this condition is called osteomalacia. In children, this condition is called rickets. What are the causes? This condition may be caused by:  Not eating enough foods that contain vitamin D.  Not getting enough natural sun exposure.  Having certain digestive system diseases that make it difficult for your body to absorb vitamin D. These diseases include Crohn's disease, chronic pancreatitis, and cystic fibrosis.  Having a surgery in which a part of the stomach or a part of the small intestine is removed.  Having chronic kidney disease or liver disease. What increases the risk? You are more likely to develop this condition if you:  Are older.  Do not spend much time outdoors.  Live in a long-term care facility.  Have had broken bones.  Have weak or thin bones (osteoporosis).  Have a disease or condition that changes how the body absorbs vitamin D.  Have dark skin.  Take certain medicines, such as steroid medicines or certain seizure medicines.  Are overweight or obese. What are the signs or symptoms? In mild cases of vitamin D deficiency, there may not be any symptoms. If the condition is severe, symptoms may include:  Bone pain.  Muscle pain.  Falling often.  Broken bones caused by a minor injury. How is this diagnosed? This condition may be diagnosed with blood tests. Imaging tests such as X-rays may also be done to look for changes in the bone. How is this  treated? Treatment for this condition may depend on what caused the condition. Treatment options include:  Taking vitamin D supplements. Your health care provider will suggest what dose is best for you.  Taking a calcium supplement. Your health care provider will suggest what dose is best for you. Follow these instructions at home: Eating and drinking   Eat foods that contain vitamin D. Choices include: ? Fortified dairy products, cereals, or juices. Fortified means that vitamin D has been added to the food. Check the label on the package to see if the food is fortified. ? Fatty fish, such as salmon or trout. ? Eggs. ? Oysters. ? Mushrooms. The items listed above may not be a complete list of recommended foods and beverages. Contact a dietitian for more information. General instructions  Take medicines and supplements only as told by your health care provider.  Get regular, safe exposure to natural sunlight.  Do not use a tanning bed.  Maintain a healthy weight. Lose weight if needed.  Keep all follow-up visits as told by your health care provider. This is important. How is this prevented? You can get vitamin D by:  Eating foods that naturally contain vitamin D.  Eating or drinking products that have been fortified with vitamin D, such as cereals, juices, and dairy products (including milk).  Taking a vitamin D supplement or a multivitamin supplement that contains vitamin D.  Being in the sun. Your body naturally makes vitamin D when your skin is exposed to sunlight. Your body changes the sunlight into  a form of the vitamin that it can use. Contact a health care provider if:  Your symptoms do not go away.  You feel nauseous or you vomit.  You have fewer bowel movements than usual or are constipated. Summary  Vitamin D deficiency is when your body does not have enough vitamin D.  Vitamin D is important to your body for good bone health and muscle function, and it may  help prevent some diseases.  Vitamin D deficiency is primarily treated through supplementation. Your health care provider will suggest what dose is best for you.  You can get vitamin D by eating foods that contain vitamin D, by being in the sun, and by taking a vitamin D supplement or a multivitamin supplement that contains vitamin D. This information is not intended to replace advice given to you by your health care provider. Make sure you discuss any questions you have with your health care provider. Document Revised: 07/09/2018 Document Reviewed: 07/09/2018 Elsevier Patient Education  2020 [employer]  

## 2019-12-25 NOTE — Progress Notes (Signed)
This visit occurred during the SARS-CoV-2 public health emergency.  Safety protocols were in place, including screening questions prior to the visit, additional usage of staff PPE, and extensive cleaning of exam room while observing appropriate contact time as indicated for disinfecting solutions.    Meghan Vang , 01/12/1946, 74 y.o., female MRN: [id number] Patient Care Team    Relationship Specialty Notifications Start End  Meghan Hillock, DO PCP - General Family Medicine  01/19/16     Chief Complaint  Patient presents with  . Vitamin D    Patient is here to have Vit D rechecked      Subjective: Pt presents for an OV follow up on vit d levels. She was seen about 12 weeks ago and found to have a vit d level of 8. She has completed the 12 weeks course of high dose once weekly vit d script (50k/w). She has a h/o osteoporosis. She reports she has noticed a little less fatigue since starting the vit d. She is not taking a daily OTC vit d yet.   Depression screen [facility] 2/9 09/23/2019 09/20/2018 09/19/2017 03/17/2017 08/16/2016  Decreased Interest 0 0 0 0 0  Down, Depressed, Hopeless 0 0 0 0 0  PHQ - 2 Score 0 0 0 0 0  Altered sleeping - 0 - - -  Tired, decreased energy - 0 - - -  Change in appetite - 0 - - -  Feeling bad or failure about yourself  - 0 - - -  Trouble concentrating - 0 - - -  Suicidal thoughts - 0 - - -  PHQ-9 Score - 0 - - -  Difficult doing work/chores - Not difficult at all - - -    Allergies  Allergen Reactions  . Bactrim [Sulfamethoxazole-Trimethoprim]    Social History   Social History Narrative   Divorced. 2 sons, 1 daughter. Meghan Vang, Meghan Vang).   Some college. Retired. Moved from [place] to be next to family.    Drinks caffeine.    Wears her seatbelt and bike helmet.    Smoke detector in the home.    Feels safe in her relationships.    Past Medical History:  Diagnosis Date  . Allergy   . Anemia   . Chicken pox   . Depression   . Hypertension   .  Menopause    Past Surgical History:  Procedure Laterality Date  . TUBAL LIGATION     Family History  Problem Relation Age of Onset  . Colon cancer Father 96  . Diabetes Father   . Heart disease Father   . Asthma Mother   . Diabetes Mother   . Heart disease Mother   . Asthma Brother   . Heart disease Brother   . Liver cancer Maternal Grandmother   . Heart disease Maternal Grandfather   . Heart disease Paternal Grandmother   . Stroke Paternal Grandfather    Allergies as of 12/25/2019      Reactions   Bactrim [sulfamethoxazole-trimethoprim]       Medication List       Accurate as of December 25, 2019 10:34 AM. If you have any questions, ask your nurse or doctor.        STOP taking these medications   Vitamin D (Ergocalciferol) 1.25 MG (50000 UNIT) Caps capsule Commonly known as: DRISDOL Stopped by: Howard Pouch, DO     TAKE these medications   amLODipine 10 MG tablet Commonly known as: NORVASC TAKE (  1) TABLET BY MOUTH ONCE DAILY.   hydrochlorothiazide 12.5 MG tablet Commonly known as: HYDRODIURIL Take 1 tablet (12.5 mg total) by mouth daily.   lisinopril 40 MG tablet Commonly known as: ZESTRIL TAKE (1) TABLET BY MOUTH ONCE DAILY.       All past medical history, surgical history, allergies, family history, immunizations andmedications were updated in the EMR today and reviewed under the history and medication portions of their EMR.     ROS: Negative, with the exception of above mentioned in HPI   Objective:  BP 132/79 (BP Location: Right Arm, Patient Position: Sitting, Cuff Size: Normal)   Pulse 86   Temp (!) 97.2 F (36.2 C) (Temporal)   Resp 16   Ht 5\' 1"  (1.549 m)   Wt 200 lb 2 oz (90.8 kg)   SpO2 98%   BMI 37.81 kg/m  Body mass index is 37.81 kg/m. Gen: Afebrile. No acute distress. Nontoxic in appearance, well developed, well nourished.  HENT: AT. [place].  Skin: no rashes, purpura or petechiae.  Neuro:  Normal gait. PERLA. EOMi. Alert. Oriented  x3  Psych: Normal affect, dress and demeanor. Normal speech. Normal thought content and judgment.  No exam data present No results found. No results found for this or any previous visit (from the past 24 hour(s)).  Assessment/Plan: [name] is a 74 y.o. female present for OV for  Vitamin D deficiency/ Osteoporosis without current pathological fracture, unspecified osteoporosis type Levels collected today along with Parathyroid and calcium.  - Pt will be guided with appropriate OTC dose vs another round of prescribed after results received.  - PTH, Intact and Calcium - Vitamin D (25 hydroxy) - f/u PRN     Reviewed expectations re: course of current medical issues.  Discussed self-management of symptoms.  Outlined signs and symptoms indicating need for more acute intervention.  Patient verbalized understanding and all questions were answered.  Patient received an After-Visit Summary.    Orders Placed This Encounter  Procedures  . PTH, Intact and Calcium  . Vitamin D (25 hydroxy)  No orders of the defined types were placed in this encounter.  Referral Orders  No referral(s) requested today      Note is dictated utilizing voice recognition software. Although note has been proof read prior to signing, occasional typographical errors still can be missed. If any questions arise, please do not hesitate to call for verification.   electronically signed by:  Howard Pouch, DO  [place]

## 2019-12-26 LAB — EXTRA SPECIMEN

## 2019-12-26 LAB — PTH, INTACT AND CALCIUM
Calcium: 10.4 mg/dL (ref 8.6–10.4)
PTH: 21 pg/mL (ref 14–64)

## 2019-12-26 LAB — VITAMIN D 25 HYDROXY (VIT D DEFICIENCY, FRACTURES): Vit D, 25-Hydroxy: 35 ng/mL (ref 30–100)

## 2020-01-13 ENCOUNTER — Telehealth: Payer: Self-pay

## 2020-01-13 NOTE — Telephone Encounter (Signed)
Pt was called and given information.  

## 2020-01-13 NOTE — Telephone Encounter (Signed)
The two female dermatology physicians that come to mind are:  Dr. Mickel Baas L. Ubaldo Glassing, MD Dr. Linus Galas, MD

## 2020-01-13 NOTE — Telephone Encounter (Signed)
Patient is searching for a female dermatologist. Her insurance does not require a referral for dermatology but wants to know if Dr. Raoul Pitch has a female derm provider she would recommend   Please call patient at [phone]

## 2020-01-14 DIAGNOSIS — Z1211 Encounter for screening for malignant neoplasm of colon: Secondary | ICD-10-CM | POA: Diagnosis not present

## 2020-01-17 LAB — COLOGUARD
COLOGUARD: NEGATIVE
Cologuard: NEGATIVE

## 2020-01-17 LAB — EXTERNAL GENERIC LAB PROCEDURE: COLOGUARD: NEGATIVE

## 2020-01-20 ENCOUNTER — Other Ambulatory Visit: Payer: Self-pay

## 2020-01-20 DIAGNOSIS — Z1211 Encounter for screening for malignant neoplasm of colon: Secondary | ICD-10-CM

## 2020-01-20 NOTE — Addendum Note (Signed)
Addended by: Gerilyn Nestle on: 01/20/2020 02:28 PM   Modules accepted: Orders

## 2020-02-28 ENCOUNTER — Ambulatory Visit
Admission: RE | Admit: 2020-02-28 | Discharge: 2020-02-28 | Disposition: A | Discharge status: Home / Self Care | Payer: PPO | Source: Ambulatory Visit | Attending: Family Medicine | Admitting: Family Medicine

## 2020-02-28 ENCOUNTER — Other Ambulatory Visit: Payer: Self-pay

## 2020-02-28 DIAGNOSIS — Z1231 Encounter for screening mammogram for malignant neoplasm of breast: Secondary | ICD-10-CM

## 2020-03-23 ENCOUNTER — Other Ambulatory Visit: Payer: Self-pay

## 2020-03-23 ENCOUNTER — Ambulatory Visit (INDEPENDENT_AMBULATORY_CARE_PROVIDER_SITE_OTHER): Payer: PPO | Admitting: Family Medicine

## 2020-03-23 ENCOUNTER — Encounter: Payer: Self-pay | Admitting: Family Medicine

## 2020-03-23 VITALS — BP 121/75 | HR 74 | Temp 98.3°F | Resp 17 | Ht 61.0 in | Wt 201.2 lb

## 2020-03-23 DIAGNOSIS — E669 Obesity, unspecified: Secondary | ICD-10-CM | POA: Diagnosis not present

## 2020-03-23 DIAGNOSIS — I10 Essential (primary) hypertension: Secondary | ICD-10-CM | POA: Diagnosis not present

## 2020-03-23 MED ORDER — HYDROCHLOROTHIAZIDE 12.5 MG PO TABS: 12.5000 mg | ORAL_TABLET | Freq: Every day | ORAL | 1 refills | Status: DC

## 2020-03-23 MED ORDER — LISINOPRIL 40 MG PO TABS: ORAL_TABLET | ORAL | 1 refills | Status: DC

## 2020-03-23 MED ORDER — AMLODIPINE BESYLATE 10 MG PO TABS: ORAL_TABLET | ORAL | 1 refills | Status: DC

## 2020-03-23 NOTE — Patient Instructions (Signed)
Schedule your physical Nov. 10 or after. We will completed labs that day also.    Great to see you today.

## 2020-03-23 NOTE — Progress Notes (Signed)
SUBJECTIVE Chief Complaint  Patient presents with  . Hypertension    Needs refills     HPI: Meghan Vang is a 74 y.o. female present for [facility] Hypertension/obesity: Pt reports complaince withlisinopril 40 and amlodipine 10. Blood pressures ranges at home not routinely checked. Patient denies chest pain, shortness of breath, dizziness or lower extremity edema.  Ptdoes not takedaily baby ASA. Pt is notprescribed statin. Diet:low sodium Exercise:routinely Labs UTD 09/2019 RF:HTN, Fhx heart disease and stroke, Morbid obesity ROS: See pertinent positives and negatives per HPI.  Depression screen [facility] 2/9 09/23/2019 09/20/2018 09/19/2017 03/17/2017 08/16/2016  Decreased Interest 0 0 0 0 0  Down, Depressed, Hopeless 0 0 0 0 0  PHQ - 2 Score 0 0 0 0 0  Altered sleeping - 0 - - -  Tired, decreased energy - 0 - - -  Change in appetite - 0 - - -  Feeling bad or failure about yourself  - 0 - - -  Trouble concentrating - 0 - - -  Suicidal thoughts - 0 - - -  PHQ-9 Score - 0 - - -  Difficult doing work/chores - Not difficult at all - - -    Patient Active Problem List   Diagnosis Date Noted  . Vitamin D deficiency 12/25/2019  . Elevated hemoglobin A1c 09/20/2018  . Osteoporosis 09/19/2017  . Obesity (BMI 30-39.9) 03/17/2017  . Encounter for general adult medical examination with abnormal findings 01/19/2016  . Essential hypertension, benign 01/18/2016    Social History   Tobacco Use  . Smoking status: Never Smoker  . Smokeless tobacco: Never Used  Substance Use Topics  . Alcohol use: No    Alcohol/week: 0.0 standard drinks    Current Outpatient Medications:  .  amLODipine (NORVASC) 10 MG tablet, TAKE (1) TABLET BY MOUTH ONCE DAILY., Disp: 90 tablet, Rfl: 1 .  hydrochlorothiazide (HYDRODIURIL) 12.5 MG tablet, Take 1 tablet (12.5 mg total) by mouth daily., Disp: 90 tablet, Rfl: 1 .  lisinopril (ZESTRIL) 40 MG tablet, TAKE (1) TABLET BY MOUTH ONCE DAILY., Disp: 90  tablet, Rfl: 1 .  Vitamin D, Cholecalciferol, 25 MCG (1000 UT) CAPS, Take 2 capsules by mouth daily. 2,000 units daily, Disp: , Rfl:   Allergies  Allergen Reactions  . Bactrim [Sulfamethoxazole-Trimethoprim]     OBJECTIVE: BP 121/75 (BP Location: Left Arm, Patient Position: Sitting, Cuff Size: Large)   Pulse 74   Temp 98.3 F (36.8 C) (Temporal)   Resp 17   Ht 5\' 1"  (1.549 m)   Wt 201 lb 4 oz (91.3 kg)   LMP  (Exact Date)   SpO2 98%   BMI 38.03 kg/m  Gen: Afebrile. No acute distress. Nontoxic.  HENT: AT. [place].  Eyes:Pupils Equal Round Reactive to light, Extraocular movements intact,  Conjunctiva without redness, discharge or icterus. CV: RRR no murmur, no edema, +2/4 P posterior tibialis pulses Chest: CTAB, no wheeze or crackles Neuro:  Normal gait. PERLA. EOMi. Alert. Oriented x3  Psych: Normal affect, dress and demeanor. Normal speech. Normal thought content and judgment.   ASSESSMENT AND PLAN: Meghan Vang is a 74 y.o. female present for  Essential hypertension, benign/obesity  - BP looks great. - continue hctz 12.5 mg.  - continue amlodipine 10  - continue lisinopril 40  - continue to watch diet and remain active.  - f/u  5.5 months  Vit D: Now normal.  Continue 2000 u vitd qd  No orders of the defined types were placed in this  encounter.  Meds ordered this encounter  Medications  . amLODipine (NORVASC) 10 MG tablet    Sig: TAKE (1) TABLET BY MOUTH ONCE DAILY.    Dispense:  90 tablet    Refill:  1  . hydrochlorothiazide (HYDRODIURIL) 12.5 MG tablet    Sig: Take 1 tablet (12.5 mg total) by mouth daily.    Dispense:  90 tablet    Refill:  1  . lisinopril (ZESTRIL) 40 MG tablet    Sig: TAKE (1) TABLET BY MOUTH ONCE DAILY.    Dispense:  90 tablet    Refill:  1   Referral Orders  No referral(s) requested today    Howard Pouch, DO 03/23/2020

## 2020-04-27 ENCOUNTER — Other Ambulatory Visit: Payer: Self-pay | Admitting: Family Medicine

## 2020-04-30 DIAGNOSIS — L821 Other seborrheic keratosis: Secondary | ICD-10-CM | POA: Diagnosis not present

## 2020-04-30 DIAGNOSIS — D225 Melanocytic nevi of trunk: Secondary | ICD-10-CM | POA: Diagnosis not present

## 2020-04-30 DIAGNOSIS — D485 Neoplasm of uncertain behavior of skin: Secondary | ICD-10-CM | POA: Diagnosis not present

## 2020-04-30 DIAGNOSIS — L738 Other specified follicular disorders: Secondary | ICD-10-CM | POA: Diagnosis not present

## 2020-04-30 DIAGNOSIS — L918 Other hypertrophic disorders of the skin: Secondary | ICD-10-CM | POA: Diagnosis not present

## 2020-05-12 DIAGNOSIS — L988 Other specified disorders of the skin and subcutaneous tissue: Secondary | ICD-10-CM | POA: Diagnosis not present

## 2020-05-12 DIAGNOSIS — D485 Neoplasm of uncertain behavior of skin: Secondary | ICD-10-CM | POA: Diagnosis not present

## 2020-06-08 ENCOUNTER — Other Ambulatory Visit: Payer: Self-pay | Admitting: Family Medicine

## 2020-06-08 DIAGNOSIS — I10 Essential (primary) hypertension: Secondary | ICD-10-CM

## 2020-09-16 ENCOUNTER — Ambulatory Visit (INDEPENDENT_AMBULATORY_CARE_PROVIDER_SITE_OTHER): Payer: PPO

## 2020-09-16 VITALS — Ht 61.0 in | Wt 201.0 lb

## 2020-09-16 DIAGNOSIS — Z0000 Encounter for general adult medical examination without abnormal findings: Secondary | ICD-10-CM

## 2020-09-16 NOTE — Progress Notes (Signed)
Subjective:   Meghan Vang is a 74 y.o. female who presents for Medicare Annual (Subsequent) preventive examination.  I connected with Meghan Vang today by telephone and verified that I am speaking with the correct person using two identifiers. Location patient: home Location provider: work Persons participating in the virtual visit: patient, Meghan Vang.    I discussed the limitations, risks, security and privacy concerns of performing an evaluation and management service by telephone and the availability of in person appointments. I also discussed with the patient that there may be a patient responsible charge related to this service. The patient expressed understanding and verbally consented to this telephonic visit.    Interactive audio and video telecommunications were attempted between this provider and patient, however failed, due to patient having technical difficulties OR patient did not have access to video capability.  We continued and completed visit with audio only.  Some vital signs may be absent or patient reported.   Time Spent with patient on telephone encounter: 30 minutes  Review of Systems     Cardiac Risk Factors include: advanced age (>67men, >73 women);hypertension;obesity (BMI >30kg/m2);sedentary lifestyle     Objective:    Today's Vitals   09/16/20 0816  Weight: 201 lb (91.2 kg)  Height: 5\' 1"  (1.549 m)   Body mass index is 37.98 kg/m.  Advanced Directives 09/16/2020 03/17/2017  Does Patient Have a Medical Advance Directive? No No  Would patient like information on creating a medical advance directive? Yes (MAU/Ambulatory/Procedural Areas - Information given) Yes (MAU/Ambulatory/Procedural Areas - Information given)    Current Medications (verified) Outpatient Encounter Medications as of 09/16/2020  Medication Sig  . amLODipine (NORVASC) 10 MG tablet TAKE (1) TABLET BY MOUTH ONCE DAILY.  . hydrochlorothiazide (MICROZIDE) 12.5 MG capsule TAKE 1 CAPSULE BY MOUTH  ONCE DAILY.  Marland Kitchen lisinopril (ZESTRIL) 40 MG tablet TAKE (1) TABLET BY MOUTH ONCE DAILY.  Marland Kitchen Vitamin D, Cholecalciferol, 25 MCG (1000 UT) CAPS Take 2 capsules by mouth daily. 2,000 units daily   No facility-administered encounter medications on file as of 09/16/2020.    Allergies (verified) Bactrim [sulfamethoxazole-trimethoprim]   History: Past Medical History:  Diagnosis Date  . Allergy   . Anemia   . Chicken pox   . Depression   . Hypertension   . Menopause    Past Surgical History:  Procedure Laterality Date  . TUBAL LIGATION     Family History  Problem Relation Age of Onset  . Colon cancer Father 56  . Diabetes Father   . Heart disease Father   . Asthma Mother   . Diabetes Mother   . Heart disease Mother   . Asthma Brother   . Heart disease Brother   . Liver cancer Maternal Grandmother   . Heart disease Maternal Grandfather   . Heart disease Paternal Grandmother   . Stroke Paternal Grandfather    Social History   Socioeconomic History  . Marital status: Single    Spouse name: Not on file  . Number of children: Not on file  . Years of education: Not on file  . Highest education level: Not on file  Occupational History  . Not on file  Tobacco Use  . Smoking status: Never Smoker  . Smokeless tobacco: Never Used  Vaping Use  . Vaping Use: Never used  Substance and Sexual Activity  . Alcohol use: No    Alcohol/week: 0.0 standard drinks  . Drug use: No  . Sexual activity: Never  Other Topics Concern  .  Not on file  Social History Narrative   Divorced. 2 sons, 1 daughter. [name], [name]).   Some college. Retired. Moved from [place] to be next to family.    Drinks caffeine.    Wears her seatbelt and bike helmet.    Smoke detector in the home.    Feels safe in her relationships.    Social Determinants of Health   Financial Resource Strain:   . Difficulty of Paying Living Expenses: Not on file  Food Insecurity:   . Worried About Charity fundraiser  in the Last Year: Not on file  . Ran Out of Food in the Last Year: Not on file  Transportation Needs:   . Lack of Transportation (Medical): Not on file  . Lack of Transportation (Non-Medical): Not on file  Physical Activity:   . Days of Exercise per Week: Not on file  . Minutes of Exercise per Session: Not on file  Stress:   . Feeling of Stress : Not on file  Social Connections:   . Frequency of Communication with Friends and Family: Not on file  . Frequency of Social Gatherings with Friends and Family: Not on file  . Attends Religious Services: Not on file  . Active Member of Clubs or Organizations: Not on file  . Attends Archivist Meetings: Not on file  . Marital Status: Not on file    Tobacco Counseling Counseling given: Not Answered   Clinical Intake:  Pre-visit preparation completed: Yes  Pain : No/denies pain     Nutritional Status: BMI > 30  Obese Nutritional Risks: None Diabetes: No  How often do you need to have someone help you when you read instructions, pamphlets, or other written materials from your doctor or pharmacy?: 1 - Never What is the last grade level you completed in school?: 12th grade  Diabetic?No Interpreter Needed?: No  Information entered by :: Caroleen Hamman LPN   Activities of Daily Living In your present state of health, do you have any difficulty performing the following activities: 09/16/2020  Hearing? N  Vision? N  Difficulty concentrating or making decisions? N  Walking or climbing stairs? N  Dressing or bathing? N  Doing errands, shopping? N  Preparing Food and eating ? N  Using the Toilet? N  In the past six months, have you accidently leaked urine? N  Do you have problems with loss of bowel control? N  Managing your Medications? N  Managing your Finances? N  Housekeeping or managing your Housekeeping? N  Some recent data might be hidden    Patient Care Team: Ma Hillock, DO as PCP - General (Family  Medicine)  Indicate any recent Medical Services you may have received from other than [facility] providers in the past year (date may be approximate).     Assessment:   This is a routine wellness examination for Meghan Vang.  Hearing/Vision screen  Hearing Screening   125Hz  250Hz  500Hz  1000Hz  2000Hz  3000Hz  4000Hz  6000Hz  8000Hz   Right ear:           Left ear:           Comments: No issues  Vision Screening Comments: Reading glasses Last eye exam-unsure of date  Dietary issues and exercise activities discussed: Current Exercise Habits: The patient does not participate in regular exercise at present, Exercise limited by: None identified  Goals    . Patient Stated     Drink more water and increase activity  Depression Screen PHQ 2/9 Scores 09/16/2020 09/23/2019 09/20/2018 09/19/2017 03/17/2017 08/16/2016  PHQ - 2 Score 0 0 0 0 0 0  PHQ- 9 Score - - 0 - - -    Fall Risk Fall Risk  09/16/2020 09/20/2018 09/19/2017 03/17/2017 08/16/2016  Falls in the past year? 0 0 No No Yes  Number falls in past yr: 0 - - - 1  Injury with Fall? 0 - - - No  Risk for fall due to : - - - - Impaired balance/gait  Follow up Falls prevention discussed Falls evaluation completed - - Falls prevention discussed    Any stairs in or around the home? Yes  If so, are there any without handrails? No  Home free of loose throw rugs in walkways, pet beds, electrical cords, etc? Yes  Adequate lighting in your home to reduce risk of falls? Yes   ASSISTIVE DEVICES UTILIZED TO PREVENT FALLS:  Life alert? No  Use of a cane, walker or w/c? No  Grab bars in the bathroom? No  Shower chair or bench in shower? No  Elevated toilet seat or a handicapped toilet? No   TIMED UP AND GO:  Was the test performed? No . Phone visit   Cognitive Function:No cognitive impairment noted        Immunizations Immunization History  Administered Date(s) Administered  . Fluad Quad(high Dose 65+) 08/12/2019  . Influenza, High Dose  Seasonal PF 09/20/2018  . Moderna SARS-COVID-2 Vaccination 12/19/2019, 01/17/2020  . Pneumococcal Conjugate-13 01/19/2016  . Pneumococcal Polysaccharide-23 09/19/2017  . Tdap 01/19/2016    TDAP status: Up to date   Flu vaccine status: Due- Patient plans to get vaccine at her office visit next week.  Pneumococcal vaccine status: Up to date   Covid-19 vaccine status: Completed vaccines  Qualifies for Shingles Vaccine? Yes   Zostavax completed No   Shingrix Completed?: No.    Education has been provided regarding the importance of this vaccine. Patient has been advised to call insurance company to determine out of pocket expense if they have not yet received this vaccine. Advised may also receive vaccine at local pharmacy or Health Dept. Verbalized acceptance and understanding.  Screening Tests Health Maintenance  Topic Date Due  . INFLUENZA VACCINE  06/14/2020  . MAMMOGRAM  02/27/2022  . Fecal DNA (Cologuard)  01/14/2023  . TETANUS/TDAP  01/18/2026  . DEXA SCAN  Completed  . COVID-19 Vaccine  Completed  . Hepatitis C Screening  Completed  . PNA vac Low Risk Adult  Completed    Health Maintenance  Health Maintenance Due  Topic Date Due  . INFLUENZA VACCINE  06/14/2020    Colorectal cancer screening: Completed Cologuard 01/14/2020. Repeat every 3 years   Mammogram status: Completed Bilateral 02/28/2020. Repeat every year   Bone Density status: Completed 10/09/2018. Results reflect: Bone density results: OSTEOPOROSIS. Repeat every 2 years.  Lung Cancer Screening: (Low Dose CT Chest recommended if Age 47-80 years, 30 pack-year currently smoking OR have quit w/in 15years.) does not qualify.     Additional Screening:  Hepatitis C Screening: Completed 08/16/2016  Vision Screening: Recommended annual ophthalmology exams for early detection of glaucoma and other disorders of the eye. Is the patient up to date with their annual eye exam?  No  Who is the provider or what is the  name of the office in which the patient attends annual eye exams? Unsure of name If pt is not established with a provider, would they like to be referred to  a provider to establish care? No .   Dental Screening: Recommended annual dental exams for proper oral hygiene  Community Resource Referral / Chronic Care Management: CRR required this visit?  No   CCM required this visit?  No      Plan:     I have personally reviewed and noted the following in the patient's chart:   . Medical and social history . Use of alcohol, tobacco or illicit drugs  . Current medications and supplements . Functional ability and status . Nutritional status . Physical activity . Advanced directives . List of other physicians . Hospitalizations, surgeries, and ER visits in previous 12 months . Vitals . Screenings to include cognitive, depression, and falls . Referrals and appointments  In addition, I have reviewed and discussed with patient certain preventive protocols, quality metrics, and best practice recommendations. A written personalized care plan for preventive services as well as general preventive health recommendations were provided to patient.   Due to this being a telephonic visit, the after visit summary with patients personalized plan was offered to patient via mail or my-chart. Patient would like to access on my-chart.   Marta Antu, LPN   [id number]  Nurse Health Advisor  Nurse Notes: None

## 2020-09-16 NOTE — Patient Instructions (Signed)
Meghan Vang , Thank you for taking time to complete your Medicare Wellness Visit. I appreciate your ongoing commitment to your health goals. Please review the following plan we discussed and let me know if I can assist you in the future.   Screening recommendations/referrals: Colonoscopy: Cologuard completed 01/14/2020- No longer required after age 75. Mammogram: Completed 02/28/2020-Due 02/27/2021 Bone Density: Completed 10/09/2018- Due 10/09/2020 Recommended yearly ophthalmology/optometry visit for glaucoma screening and checkup Recommended yearly dental visit for hygiene and checkup  Vaccinations: Influenza vaccine: Due-May obtain at our office or your local pharmacy Pneumococcal vaccine: Completed vaccines Tdap vaccine: Up to Date-Due-01/18/2026 Shingles vaccine: Discuss with pharmacy Covid-19:Up to Date  Advanced directives: Information mailed today.  Conditions/risks identified: See problem list  Next appointment: Follow up in one year for your annual wellness visit 09/22/2021 @ 8:15am   Preventive Care 65 Years and Older, Female Preventive care refers to lifestyle choices and visits with your health care provider that can promote health and wellness. What does preventive care include?  A yearly physical exam. This is also called an annual well check.  Dental exams once or twice a year.  Routine eye exams. Ask your health care provider how often you should have your eyes checked.  Personal lifestyle choices, including:  Daily care of your teeth and gums.  Regular physical activity.  Eating a healthy diet.  Avoiding tobacco and drug use.  Limiting alcohol use.  Practicing safe sex.  Taking low-dose aspirin every day.  Taking vitamin and mineral supplements as recommended by your health care provider. What happens during an annual well check? The services and screenings done by your health care provider during your annual well check will depend on your age, overall  health, lifestyle risk factors, and family history of disease. Counseling  Your health care provider may ask you questions about your:  Alcohol use.  Tobacco use.  Drug use.  Emotional well-being.  Home and relationship well-being.  Sexual activity.  Eating habits.  History of falls.  Memory and ability to understand (cognition).  Work and work Statistician.  Reproductive health. Screening  You may have the following tests or measurements:  Height, weight, and BMI.  Blood pressure.  Lipid and cholesterol levels. These may be checked every 5 years, or more frequently if you are over 54 years old.  Skin check.  Lung cancer screening. You may have this screening every year starting at age 101 if you have a 30-pack-year history of smoking and currently smoke or have quit within the past 15 years.  Fecal occult blood test (FOBT) of the stool. You may have this test every year starting at age 48.  Flexible sigmoidoscopy or colonoscopy. You may have a sigmoidoscopy every 5 years or a colonoscopy every 10 years starting at age 25.  Hepatitis C blood test.  Hepatitis B blood test.  Sexually transmitted disease (STD) testing.  Diabetes screening. This is done by checking your blood sugar (glucose) after you have not eaten for a while (fasting). You may have this done every 1-3 years.  Bone density scan. This is done to screen for osteoporosis. You may have this done starting at age 49.  Mammogram. This may be done every 1-2 years. Talk to your health care provider about how often you should have regular mammograms. Talk with your health care provider about your test results, treatment options, and if necessary, the need for more tests. Vaccines  Your health care provider may recommend certain vaccines, such as:  Influenza vaccine. This is recommended every year.  Tetanus, diphtheria, and acellular pertussis (Tdap, Td) vaccine. You may need a Td booster every 10  years.  Zoster vaccine. You may need this after age 68.  Pneumococcal 13-valent conjugate (PCV13) vaccine. One dose is recommended after age 59.  Pneumococcal polysaccharide (PPSV23) vaccine. One dose is recommended after age 36. Talk to your health care provider about which screenings and vaccines you need and how often you need them. This information is not intended to replace advice given to you by your health care provider. Make sure you discuss any questions you have with your health care provider. Document Released: 11/27/2015 Document Revised: 07/20/2016 Document Reviewed: 09/01/2015 Elsevier Interactive Patient Education  2017 [place] Prevention in the Home Falls can cause injuries. They can happen to people of all ages. There are many things you can do to make your home safe and to help prevent falls. What can I do on the outside of my home?  Regularly fix the edges of walkways and driveways and fix any cracks.  Remove anything that might make you trip as you walk through a door, such as a raised step or threshold.  Trim any bushes or trees on the path to your home.  Use bright outdoor lighting.  Clear any walking paths of anything that might make someone trip, such as rocks or tools.  Regularly check to see if handrails are loose or broken. Make sure that both sides of any steps have handrails.  Any raised decks and porches should have guardrails on the edges.  Have any leaves, snow, or ice cleared regularly.  Use sand or salt on walking paths during winter.  Clean up any spills in your garage right away. This includes oil or grease spills. What can I do in the bathroom?  Use night lights.  Install grab bars by the toilet and in the tub and shower. Do not use towel bars as grab bars.  Use non-skid mats or decals in the tub or shower.  If you need to sit down in the shower, use a plastic, non-slip stool.  Keep the floor dry. Clean up any water that  spills on the floor as soon as it happens.  Remove soap buildup in the tub or shower regularly.  Attach bath mats securely with double-sided non-slip rug tape.  Do not have throw rugs and other things on the floor that can make you trip. What can I do in the bedroom?  Use night lights.  Make sure that you have a light by your bed that is easy to reach.  Do not use any sheets or blankets that are too big for your bed. They should not hang down onto the floor.  Have a firm chair that has side arms. You can use this for support while you get dressed.  Do not have throw rugs and other things on the floor that can make you trip. What can I do in the kitchen?  Clean up any spills right away.  Avoid walking on wet floors.  Keep items that you use a lot in easy-to-reach places.  If you need to reach something above you, use a strong step stool that has a grab bar.  Keep electrical cords out of the way.  Do not use floor polish or wax that makes floors slippery. If you must use wax, use non-skid floor wax.  Do not have throw rugs and other things on the floor that  can make you trip. What can I do with my stairs?  Do not leave any items on the stairs.  Make sure that there are handrails on both sides of the stairs and use them. Fix handrails that are broken or loose. Make sure that handrails are as long as the stairways.  Check any carpeting to make sure that it is firmly attached to the stairs. Fix any carpet that is loose or worn.  Avoid having throw rugs at the top or bottom of the stairs. If you do have throw rugs, attach them to the floor with carpet tape.  Make sure that you have a light switch at the top of the stairs and the bottom of the stairs. If you do not have them, ask someone to add them for you. What else can I do to help prevent falls?  Wear shoes that:  Do not have high heels.  Have rubber bottoms.  Are comfortable and fit you well.  Are closed at the  toe. Do not wear sandals.  If you use a stepladder:  Make sure that it is fully opened. Do not climb a closed stepladder.  Make sure that both sides of the stepladder are locked into place.  Ask someone to hold it for you, if possible.  Clearly mark and make sure that you can see:  Any grab bars or handrails.  First and last steps.  Where the edge of each step is.  Use tools that help you move around (mobility aids) if they are needed. These include:  Canes.  Walkers.  Scooters.  Crutches.  Turn on the lights when you go into a dark area. Replace any light bulbs as soon as they burn out.  Set up your furniture so you have a clear path. Avoid moving your furniture around.  If any of your floors are uneven, fix them.  If there are any pets around you, be aware of where they are.  Review your medicines with your doctor. Some medicines can make you feel dizzy. This can increase your chance of falling. Ask your doctor what other things that you can do to help prevent falls. This information is not intended to replace advice given to you by your health care provider. Make sure you discuss any questions you have with your health care provider. Document Released: 08/27/2009 Document Revised: 04/07/2016 Document Reviewed: 12/05/2014 Elsevier Interactive Patient Education  2017 [employer].

## 2020-09-23 ENCOUNTER — Encounter: Payer: Self-pay | Admitting: Family Medicine

## 2020-09-23 ENCOUNTER — Other Ambulatory Visit: Payer: Self-pay

## 2020-09-23 ENCOUNTER — Ambulatory Visit (INDEPENDENT_AMBULATORY_CARE_PROVIDER_SITE_OTHER): Payer: PPO | Admitting: Family Medicine

## 2020-09-23 VITALS — BP 123/74 | HR 79 | Temp 98.3°F | Ht 60.75 in | Wt 200.0 lb

## 2020-09-23 DIAGNOSIS — Z23 Encounter for immunization: Secondary | ICD-10-CM | POA: Diagnosis not present

## 2020-09-23 DIAGNOSIS — Z0001 Encounter for general adult medical examination with abnormal findings: Secondary | ICD-10-CM | POA: Diagnosis not present

## 2020-09-23 DIAGNOSIS — I10 Essential (primary) hypertension: Secondary | ICD-10-CM

## 2020-09-23 DIAGNOSIS — M810 Age-related osteoporosis without current pathological fracture: Secondary | ICD-10-CM | POA: Diagnosis not present

## 2020-09-23 DIAGNOSIS — E669 Obesity, unspecified: Secondary | ICD-10-CM

## 2020-09-23 DIAGNOSIS — E559 Vitamin D deficiency, unspecified: Secondary | ICD-10-CM

## 2020-09-23 DIAGNOSIS — R7309 Other abnormal glucose: Secondary | ICD-10-CM

## 2020-09-23 DIAGNOSIS — Z1231 Encounter for screening mammogram for malignant neoplasm of breast: Secondary | ICD-10-CM | POA: Diagnosis not present

## 2020-09-23 LAB — COMPREHENSIVE METABOLIC PANEL
ALT: 11 U/L (ref 0–35)
AST: 15 U/L (ref 0–37)
Albumin: 4.4 g/dL (ref 3.5–5.2)
Alkaline Phosphatase: 60 U/L (ref 39–117)
BUN: 16 mg/dL (ref 6–23)
CO2: 30 mEq/L (ref 19–32)
Calcium: 9.5 mg/dL (ref 8.4–10.5)
Chloride: 100 mEq/L (ref 96–112)
Creatinine, Ser: 0.88 mg/dL (ref 0.40–1.20)
GFR: 64.73 mL/min (ref >60.00)
Glucose, Bld: 97 mg/dL (ref 70–99)
Potassium: 4.3 mEq/L (ref 3.5–5.1)
Sodium: 138 mEq/L (ref 135–145)
Total Bilirubin: 0.4 mg/dL (ref 0.2–1.2)
Total Protein: 7.2 g/dL (ref 6.0–8.3)

## 2020-09-23 LAB — LIPID PANEL
Cholesterol: 145 mg/dL (ref 0–200)
HDL: 69.9 mg/dL (ref >39.00)
LDL Cholesterol: 54 mg/dL (ref 0–99)
NonHDL: 75.21
Total CHOL/HDL Ratio: 2
Triglycerides: 108 mg/dL (ref 0.0–149.0)
VLDL: 21.6 mg/dL (ref 0.0–40.0)

## 2020-09-23 LAB — CBC
HCT: 40.5 % (ref 36.0–46.0)
Hemoglobin: 13.5 g/dL (ref 12.0–15.0)
MCHC: 33.3 g/dL (ref 30.0–36.0)
MCV: 91.3 fl (ref 78.0–100.0)
Platelets: 274 10*3/uL (ref 150.0–400.0)
RBC: 4.43 Mil/uL (ref 3.87–5.11)
RDW: 13.8 % (ref 11.5–15.5)
WBC: 8.6 10*3/uL (ref 4.0–10.5)

## 2020-09-23 LAB — VITAMIN D 25 HYDROXY (VIT D DEFICIENCY, FRACTURES): VITD: 33.29 ng/mL (ref 30.00–100.00)

## 2020-09-23 LAB — TSH: TSH: 1.38 u[IU]/mL (ref 0.35–4.50)

## 2020-09-23 LAB — HEMOGLOBIN A1C: Hgb A1c MFr Bld: 6 % (ref 4.6–6.5)

## 2020-09-23 MED ORDER — HYDROCHLOROTHIAZIDE 12.5 MG PO CAPS
12.5000 mg | ORAL_CAPSULE | Freq: Every day | ORAL | 1 refills | Status: DC
Start: 2020-09-23 — End: 2021-02-22

## 2020-09-23 MED ORDER — LISINOPRIL 40 MG PO TABS: ORAL_TABLET | ORAL | 1 refills | Status: DC

## 2020-09-23 MED ORDER — AMLODIPINE BESYLATE 10 MG PO TABS: ORAL_TABLET | ORAL | 1 refills | Status: DC

## 2020-09-23 MED ORDER — SHINGRIX 50 MCG/0.5ML IM SUSR: 0.5000 mL | Freq: Once | INTRAMUSCULAR | 1 refills | Status: AC

## 2020-09-23 NOTE — Progress Notes (Signed)
This visit occurred during the SARS-CoV-2 public health emergency.  Safety protocols were in place, including screening questions prior to the visit, additional usage of staff PPE, and extensive cleaning of exam room while observing appropriate contact time as indicated for disinfecting solutions.    Patient ID: Meghan Vang, female  DOB: 1946-01-12, 74 y.o.   MRN: [id number] Patient Care Team    Relationship Specialty Notifications Start End  Ma Hillock, DO PCP - General Family Medicine  01/19/16     Chief Complaint  Patient presents with  . Annual Exam    pt is fasting;    Subjective:  Meghan Vang is a 74 y.o.  Female  present for CPE. All past medical history, surgical history, allergies, family history, immunizations, medications and social history were updated in the electronic medical record today. All recent labs, ED visits and hospitalizations within the last year were reviewed.  Health maintenance:  Colonoscopy: Fhx in father.Cologuard neg 01/2020- [place] Mammogram: No Fhx. Last mammogram. 02/28/2020 Cervical cancer screening: not indicated. Immunizations:flu shotUTD administered today. tdap UTD 01/19/2016,PNA series completed 2018.Shingrix script provided again today. Covid series completed. Infectious disease screening:hep c completed DEXA: osteoporosis. Vit d def.  Assistive device: none Oxygen CVE:LFYB Patient has a Dental home. Hospitalizations/ED visits: reviewed  Hypertension/obesity: Pt reportscompliance withlisinopril 40 and amlodipine 10. Blood pressures ranges at home not routinely checked. Patient denies chest pain, shortness of breath, dizziness or lower extremity edema.  Ptdoes not takedaily baby ASA. Pt is notprescribed statin. Diet:low sodium Exercise:routinely Labs UTD 09/2019 RF:HTN, Fhx heart disease and stroke, Morbid obesity  Depression screen [facility] 2/9 09/16/2020 09/23/2019 09/20/2018 09/19/2017 03/17/2017  Decreased Interest 0 0 0 0  0  Down, Depressed, Hopeless 0 0 0 0 0  PHQ - 2 Score 0 0 0 0 0  Altered sleeping - - 0 - -  Tired, decreased energy - - 0 - -  Change in appetite - - 0 - -  Feeling bad or failure about yourself  - - 0 - -  Trouble concentrating - - 0 - -  Suicidal thoughts - - 0 - -  PHQ-9 Score - - 0 - -  Difficult doing work/chores - - Not difficult at all - -   No flowsheet data found.   Immunization History  Administered Date(s) Administered  . Fluad Quad(high Dose 65+) 08/12/2019, 09/23/2020  . Influenza, High Dose Seasonal PF 09/20/2018  . Moderna SARS-COVID-2 Vaccination 12/19/2019, 01/17/2020  . Pneumococcal Conjugate-13 01/19/2016  . Pneumococcal Polysaccharide-23 09/19/2017  . Tdap 01/19/2016   Past Medical History:  Diagnosis Date  . Allergy   . Anemia   . Chicken pox   . Depression   . Hypertension   . Menopause    Allergies  Allergen Reactions  . Bactrim [Sulfamethoxazole-Trimethoprim]    Past Surgical History:  Procedure Laterality Date  . SKIN BIOPSY    . TUBAL LIGATION     Family History  Problem Relation Age of Onset  . Colon cancer Father 57  . Diabetes Father   . Heart disease Father   . Asthma Mother   . Diabetes Mother   . Heart disease Mother   . Asthma Brother   . Heart disease Brother   . Liver cancer Maternal Grandmother   . Heart disease Maternal Grandfather   . Heart disease Paternal Grandmother   . Stroke Paternal Grandfather    Social History   Social History Narrative   Divorced. 2 sons, 1 daughter. Meghan Vang, Meghan Vang,  Meghan Vang).   Some college. Retired. Moved from [place] to be next to family.    Drinks caffeine.    Wears her seatbelt and bike helmet.    Smoke detector in the home.    Feels safe in her relationships.     Allergies as of 09/23/2020      Reactions   Bactrim [sulfamethoxazole-trimethoprim]       Medication List       Accurate as of September 23, 2020  9:05 AM. If you have any questions, ask your nurse or doctor.         amLODipine 10 MG tablet Commonly known as: NORVASC TAKE (1) TABLET BY MOUTH ONCE DAILY.   hydrochlorothiazide 12.5 MG capsule Commonly known as: MICROZIDE Take 1 capsule (12.5 mg total) by mouth daily.   lisinopril 40 MG tablet Commonly known as: ZESTRIL TAKE (1) TABLET BY MOUTH ONCE DAILY.   Shingrix injection Generic drug: Zoster Vaccine Adjuvanted Inject 0.5 mLs into the muscle once for 1 dose. Started by: Howard Pouch, DO   Vitamin D (Cholecalciferol) 25 MCG (1000 UT) Caps Take 2 capsules by mouth daily. 2,000 units daily       All past medical history, surgical history, allergies, family history, immunizations andmedications were updated in the EMR today and reviewed under the history and medication portions of their EMR.     No results found for this or any previous visit (from the past 2160 hour(s)).  MM 3D SCREEN BREAST BILATERAL  Result Date: 02/28/2020 CLINICAL DATA:  Screening. EXAM: DIGITAL SCREENING BILATERAL MAMMOGRAM WITH TOMO AND CAD COMPARISON:  Previous exam(s). ACR Breast Density Category b: There are scattered areas of fibroglandular density. FINDINGS: There are no findings suspicious for malignancy. Images were processed with CAD. IMPRESSION: No mammographic evidence of malignancy. A result letter of this screening mammogram will be mailed directly to the patient. RECOMMENDATION: Screening mammogram in one year. (Code:SM-B-01Y) BI-RADS CATEGORY  1: Negative. Electronically Signed   By: Curlene Dolphin M.D.   On: 02/28/2020 11:26   ROS: 14 pt review of systems performed and negative (unless mentioned in an HPI)  Objective: BP 123/74   Pulse 79   Temp 98.3 F (36.8 C) (Oral)   Ht 5' 0.75" (1.543 m)   Wt 200 lb (90.7 kg)   SpO2 98%   BMI 38.10 kg/m  Gen: Afebrile. No acute distress. Nontoxic in appearance, well-developed, well-nourished,  Pleasant female.  HENT: AT. [place]. Bilateral TM visualized and normal in appearance, normal external auditory canal. MMM,  no oral lesions, adequate dentition. Bilateral nares within normal limits. Throat without erythema, ulcerations or exudates. no Cough on exam, no hoarseness on exam. Eyes:Pupils Equal Round Reactive to light, Extraocular movements intact,  Conjunctiva without redness, discharge or icterus. Neck/lymp/endocrine: Supple,no lymphadenopathy, no thyromegaly CV: RRR no murmur, no edema, +2/4 P posterior tibialis pulses.  Chest: CTAB, no wheeze, rhonchi or crackles. normal Respiratory effort. good Air movement. Abd: Soft. obese. NTND. BS present. no Masses palpated. No hepatosplenomegaly. No rebound tenderness or guarding. Skin: no rashes, purpura or petechiae. Warm and well-perfused. Skin intact. Neuro/Msk: Normal gait. PERLA. EOMi. Alert. Oriented x3.  Cranial nerves II through XII intact. Muscle strength 5/5 upper/lower extremity. DTRs equal bilaterally. Psych: Normal affect, dress and demeanor. Normal speech. Normal thought content and judgment.   No exam data present  Assessment/plan: Milena Liggett is a 74 y.o. female present for  CPE  Need for influenza vaccination - Flu Vaccine QUAD High Dose(Fluad) Need for shingles vaccine -  printed - Zoster Vaccine Adjuvanted [facility]) injection; Inject 0.5 mLs into the muscle once for 1 dose.  Dispense: 0.5 mL; Refill: 1 Essential hypertension, benign/ Obesity (BMI 30-39.9) Stable.  - Comprehensive metabolic panel - Lipid panel - TSH - CBC -  Continue lisinopril (ZESTRIL) 40 MG tablet - continue amLODipine (NORVASC) 10 MG tablet - continue HCTZ - 5.5 mos follow up  Osteoporosis without current pathological fracture, unspecified osteoporosis type/Vitamin D deficiency - VITAMIN D 25 Hydroxy (Vit-D Deficiency, Fractures) - DG Bone Density; Future Elevated hemoglobin A1c - Hemoglobin A1c Breast cancer screening by mammogram - MM 3D SCREEN BREAST BILATERAL; Future Encounter for preventive maintenance exam: Patient was encouraged to exercise  greater than 150 minutes a week. Patient was encouraged to choose a diet filled with fresh fruits and vegetables, and lean meats. AVS provided to patient today for education/recommendation on gender specific health and safety maintenance. Colonoscopy: Fhx in father.Cologuard neg 01/2020- [place] Mammogram: No Fhx. Last mammogram. 02/28/2020 Cervical cancer screening: not indicated. Immunizations:flu shotUTD administered today. tdap UTD 01/19/2016,PNA series completed 2018.Shingrix script provided again today. Covid series completed. Infectious disease screening:hep c completed  Return in about 5 months (around 03/09/2021) for [place] (30 min) and 1 yr cpe.    Orders Placed This Encounter  Procedures  . MM 3D SCREEN BREAST BILATERAL  . DG Bone Density  . Flu Vaccine QUAD High Dose(Fluad)  . Comprehensive metabolic panel  . Hemoglobin A1c  . Lipid panel  . TSH  . VITAMIN D 25 Hydroxy (Vit-D Deficiency, Fractures)  . CBC   Meds ordered this encounter  Medications  . Zoster Vaccine Adjuvanted [facility]) injection    Sig: Inject 0.5 mLs into the muscle once for 1 dose.    Dispense:  0.5 mL    Refill:  1    Repeat in 2-6 months.  Marland Kitchen lisinopril (ZESTRIL) 40 MG tablet    Sig: TAKE (1) TABLET BY MOUTH ONCE DAILY.    Dispense:  90 tablet    Refill:  1  . amLODipine (NORVASC) 10 MG tablet    Sig: TAKE (1) TABLET BY MOUTH ONCE DAILY.    Dispense:  90 tablet    Refill:  1  . hydrochlorothiazide (MICROZIDE) 12.5 MG capsule    Sig: Take 1 capsule (12.5 mg total) by mouth daily.    Dispense:  90 capsule    Refill:  1   Referral Orders  No referral(s) requested today     Electronically signed by: Howard Pouch, [place]

## 2020-09-23 NOTE — Patient Instructions (Signed)
Health Maintenance, Female Adopting a healthy lifestyle and getting preventive care are important in promoting health and wellness. Ask your health care provider about:  The right schedule for you to have regular tests and exams.  Things you can do on your own to prevent diseases and keep yourself healthy. What should I know about diet, weight, and exercise? Eat a healthy diet   Eat a diet that includes plenty of vegetables, fruits, low-fat dairy products, and lean protein.  Do not eat a lot of foods that are high in solid fats, added sugars, or sodium. Maintain a healthy weight Body mass index (BMI) is used to identify weight problems. It estimates body fat based on height and weight. Your health care provider can help determine your BMI and help you achieve or maintain a healthy weight. Get regular exercise Get regular exercise. This is one of the most important things you can do for your health. Most adults should:  Exercise for at least 150 minutes each week. The exercise should increase your heart rate and make you sweat (moderate-intensity exercise).  Do strengthening exercises at least twice a week. This is in addition to the moderate-intensity exercise.  Spend less time sitting. Even light physical activity can be beneficial. Watch cholesterol and blood lipids Have your blood tested for lipids and cholesterol at 74 years of age, then have this test every 5 years. Have your cholesterol levels checked more often if:  Your lipid or cholesterol levels are high.  You are older than 74 years of age.  You are at high risk for heart disease. What should I know about cancer screening? Depending on your health history and family history, you may need to have cancer screening at various ages. This may include screening for:  Breast cancer.  Cervical cancer.  Colorectal cancer.  Skin cancer.  Lung cancer. What should I know about heart disease, diabetes, and high blood  pressure? Blood pressure and heart disease  High blood pressure causes heart disease and increases the risk of stroke. This is more likely to develop in people who have high blood pressure readings, are of African descent, or are overweight.  Have your blood pressure checked: ? Every 3-5 years if you are 18-39 years of age. ? Every year if you are 40 years old or older. Diabetes Have regular diabetes screenings. This checks your fasting blood sugar level. Have the screening done:  Once every three years after age 40 if you are at a normal weight and have a low risk for diabetes.  More often and at a younger age if you are overweight or have a high risk for diabetes. What should I know about preventing infection? Hepatitis B If you have a higher risk for hepatitis B, you should be screened for this virus. Talk with your health care provider to find out if you are at risk for hepatitis B infection. Hepatitis C Testing is recommended for:  Everyone born from 1945 through 1965.  Anyone with known risk factors for hepatitis C. Sexually transmitted infections (STIs)  Get screened for STIs, including gonorrhea and chlamydia, if: ? You are sexually active and are younger than 74 years of age. ? You are older than 74 years of age and your health care provider tells you that you are at risk for this type of infection. ? Your sexual activity has changed since you were last screened, and you are at increased risk for chlamydia or gonorrhea. Ask your health care provider if   you are at risk.  Ask your health care provider about whether you are at high risk for HIV. Your health care provider may recommend a prescription medicine to help prevent HIV infection. If you choose to take medicine to prevent HIV, you should first get tested for HIV. You should then be tested every 3 months for as long as you are taking the medicine. Pregnancy  If you are about to stop having your period (premenopausal) and  you may become pregnant, seek counseling before you get pregnant.  Take 400 to 800 micrograms (mcg) of folic acid every day if you become pregnant.  Ask for birth control (contraception) if you want to prevent pregnancy. Osteoporosis and menopause Osteoporosis is a disease in which the bones lose minerals and strength with aging. This can result in bone fractures. If you are 65 years old or older, or if you are at risk for osteoporosis and fractures, ask your health care provider if you should:  Be screened for bone loss.  Take a calcium or vitamin D supplement to lower your risk of fractures.  Be given hormone replacement therapy (HRT) to treat symptoms of menopause. Follow these instructions at home: Lifestyle  Do not use any products that contain nicotine or tobacco, such as cigarettes, e-cigarettes, and chewing tobacco. If you need help quitting, ask your health care provider.  Do not use street drugs.  Do not share needles.  Ask your health care provider for help if you need support or information about quitting drugs. Alcohol use  Do not drink alcohol if: ? Your health care provider tells you not to drink. ? You are pregnant, may be pregnant, or are planning to become pregnant.  If you drink alcohol: ? Limit how much you use to 0-1 drink a day. ? Limit intake if you are breastfeeding.  Be aware of how much alcohol is in your drink. In the [place] one drink equals one 12 oz bottle of beer (355 mL), one 5 oz glass of wine (148 mL), or one 1 oz glass of hard liquor (44 mL). General instructions  Schedule regular health, dental, and eye exams.  Stay current with your vaccines.  Tell your health care provider if: ? You often feel depressed. ? You have ever been abused or do not feel safe at home. Summary  Adopting a healthy lifestyle and getting preventive care are important in promoting health and wellness.  Follow your health care provider's instructions about healthy  diet, exercising, and getting tested or screened for diseases.  Follow your health care provider's instructions on monitoring your cholesterol and blood pressure. This information is not intended to replace advice given to you by your health care provider. Make sure you discuss any questions you have with your health care provider. Document Revised: 10/24/2018 Document Reviewed: 10/24/2018 Elsevier Patient Education  2020 Elsevier Inc.  

## 2020-10-26 ENCOUNTER — Other Ambulatory Visit: Payer: Self-pay | Admitting: Family Medicine

## 2020-11-30 ENCOUNTER — Other Ambulatory Visit: Payer: Self-pay | Admitting: Family Medicine

## 2020-11-30 DIAGNOSIS — I10 Essential (primary) hypertension: Secondary | ICD-10-CM

## 2020-12-02 ENCOUNTER — Other Ambulatory Visit: Payer: Self-pay | Admitting: Family Medicine

## 2020-12-02 DIAGNOSIS — Z1231 Encounter for screening mammogram for malignant neoplasm of breast: Secondary | ICD-10-CM

## 2021-02-22 ENCOUNTER — Other Ambulatory Visit: Payer: Self-pay

## 2021-02-22 ENCOUNTER — Encounter: Payer: Self-pay | Admitting: Family Medicine

## 2021-02-22 ENCOUNTER — Ambulatory Visit (INDEPENDENT_AMBULATORY_CARE_PROVIDER_SITE_OTHER): Payer: PPO | Admitting: Family Medicine

## 2021-02-22 VITALS — BP 111/69 | HR 72 | Temp 98.3°F | Ht 61.0 in | Wt 199.0 lb

## 2021-02-22 DIAGNOSIS — E559 Vitamin D deficiency, unspecified: Secondary | ICD-10-CM | POA: Diagnosis not present

## 2021-02-22 DIAGNOSIS — I10 Essential (primary) hypertension: Secondary | ICD-10-CM

## 2021-02-22 DIAGNOSIS — M810 Age-related osteoporosis without current pathological fracture: Secondary | ICD-10-CM | POA: Diagnosis not present

## 2021-02-22 DIAGNOSIS — E669 Obesity, unspecified: Secondary | ICD-10-CM

## 2021-02-22 DIAGNOSIS — R7309 Other abnormal glucose: Secondary | ICD-10-CM | POA: Diagnosis not present

## 2021-02-22 LAB — POCT GLYCOSYLATED HEMOGLOBIN (HGB A1C)
HbA1c POC (<> result, manual entry): 5.2 % (ref 4.0–5.6)
HbA1c, POC (controlled diabetic range): 5.2 % (ref 0.0–7.0)
HbA1c, POC (prediabetic range): 5.2 % — AB (ref 5.7–6.4)
Hemoglobin A1C: 5.2 % (ref 4.0–5.6)

## 2021-02-22 MED ORDER — AMLODIPINE BESYLATE 10 MG PO TABS: ORAL_TABLET | ORAL | 1 refills | Status: DC

## 2021-02-22 MED ORDER — HYDROCHLOROTHIAZIDE 12.5 MG PO CAPS: 12.5000 mg | ORAL_CAPSULE | Freq: Every day | ORAL | 1 refills | Status: DC

## 2021-02-22 MED ORDER — LISINOPRIL 40 MG PO TABS: ORAL_TABLET | ORAL | 1 refills | Status: DC

## 2021-02-22 NOTE — Patient Instructions (Signed)
Fasting lab work will be completed next visit.  Physical next appt.   Everything looks great and your a1c was 5.2! Great job!

## 2021-02-22 NOTE — Addendum Note (Signed)
Addended by: Kavin Leech on: 02/22/2021 09:37 AM   Modules accepted: Orders

## 2021-02-22 NOTE — Progress Notes (Signed)
This visit occurred during the SARS-CoV-2 public health emergency.  Safety protocols were in place, including screening questions prior to the visit, additional usage of staff PPE, and extensive cleaning of exam room while observing appropriate contact time as indicated for disinfecting solutions.    Patient ID: Meghan Vang, female  DOB: 04/21/1946, 75 y.o.   MRN: [id number] Patient Care Team    Relationship Specialty Notifications Start End  Ma Hillock, DO PCP - General Family Medicine  01/19/16     Chief Complaint  Patient presents with  . Follow-up    [facility];     Subjective:  Meghan Vang is a 75 y.o.  Female  present for  Hypertension/obesity: Pt reportscompliance withlisinopril 40 and amlodipine 10. Blood pressures ranges at home not routinely checked. Patient denies chest pain, shortness of breath, dizziness or lower extremity edema.  Ptdoes not takedaily baby ASA. Pt is notprescribed statin. Diet:low sodium Exercise:routinely Labs UTD  RF:HTN, Fhx heart disease and stroke, Morbid obesity  elevated a1c: 5.9> 6.0 last visit. Weight is unchanged.   Depression screen [facility] 2/9 09/16/2020 09/23/2019 09/20/2018 09/19/2017 03/17/2017  Decreased Interest 0 0 0 0 0  Down, Depressed, Hopeless 0 0 0 0 0  PHQ - 2 Score 0 0 0 0 0  Altered sleeping - - 0 - -  Tired, decreased energy - - 0 - -  Change in appetite - - 0 - -  Feeling bad or failure about yourself  - - 0 - -  Trouble concentrating - - 0 - -  Suicidal thoughts - - 0 - -  PHQ-9 Score - - 0 - -  Difficult doing work/chores - - Not difficult at all - -   No flowsheet data found.   Immunization History  Administered Date(s) Administered  . Fluad Quad(high Dose 65+) 08/12/2019, 09/23/2020  . Influenza, High Dose Seasonal PF 09/20/2018  . Moderna Sars-Covid-2 Vaccination 12/19/2019, 01/17/2020  . Pneumococcal Conjugate-13 01/19/2016  . Pneumococcal Polysaccharide-23 09/19/2017  . Tdap 01/19/2016   Past  Medical History:  Diagnosis Date  . Allergy   . Anemia   . Chicken pox   . Depression   . Hypertension   . Menopause    Allergies  Allergen Reactions  . Bactrim [Sulfamethoxazole-Trimethoprim]    Past Surgical History:  Procedure Laterality Date  . SKIN BIOPSY    . TUBAL LIGATION     Family History  Problem Relation Age of Onset  . Colon cancer Father 51  . Diabetes Father   . Heart disease Father   . Asthma Mother   . Diabetes Mother   . Heart disease Mother   . Asthma Brother   . Heart disease Brother   . Liver cancer Maternal Grandmother   . Heart disease Maternal Grandfather   . Heart disease Paternal Grandmother   . Stroke Paternal Grandfather    Social History   Social History Narrative   Divorced. 2 sons, 1 daughter. Meghan Vang, Meghan Vang).   Some college. Retired. Moved from [place] to be next to family.    Drinks caffeine.    Wears her seatbelt and bike helmet.    Smoke detector in the home.    Feels safe in her relationships.     Allergies as of 02/22/2021      Reactions   Bactrim [sulfamethoxazole-trimethoprim]       Medication List       Accurate as of February 22, 2021  9:19 AM. If you have any questions,  ask your nurse or doctor.        amLODipine 10 MG tablet Commonly known as: NORVASC TAKE (1) TABLET BY MOUTH ONCE DAILY.   Fish Oil 1000 MG Cpdr Take by mouth.   hydrochlorothiazide 12.5 MG capsule Commonly known as: MICROZIDE Take 1 capsule (12.5 mg total) by mouth daily.   lisinopril 40 MG tablet Commonly known as: ZESTRIL TAKE (1) TABLET BY MOUTH ONCE DAILY.   vitamin C 500 MG tablet Commonly known as: ASCORBIC ACID Take 500 mg by mouth daily.   Vitamin D (Cholecalciferol) 25 MCG (1000 UT) Caps Take 2 capsules by mouth daily. 2,000 units daily       All past medical history, surgical history, allergies, family history, immunizations andmedications were updated in the EMR today and reviewed under the history and medication  portions of their EMR.     No results found for this or any previous visit (from the past 2160 hour(s)).   ROS: 14 pt review of systems performed and negative (unless mentioned in an HPI)  Objective: BP 111/69   Pulse 72   Temp 98.3 F (36.8 C) (Oral)   Ht 5\' 1"  (1.549 m)   Wt 199 lb (90.3 kg)   SpO2 99%   BMI 37.60 kg/m  Gen: Afebrile. No acute distress. Nontoxic pleasant obese female.  HENT: AT. [place].  Eyes:Pupils Equal Round Reactive to light, Extraocular movements intact,  Conjunctiva without redness, discharge or icterus. Neck/lymp/endocrine: Supple,no lymphadenopathy, no thyromegaly CV: RRR no murmur, no edema, +2/4 P posterior tibialis pulses Chest: CTAB, no wheeze or crackles  Neuro:  Normal gait. PERLA. EOMi. Alert. Oriented x3 Psych: Normal affect, dress and demeanor. Normal speech. Normal thought content and judgment.  No exam data present  Assessment/plan: Cleola Perryman is a 75 y.o. female present for  Essential hypertension, benign/ Obesity (BMI 30-39.9) stable -  Continue lisinopril (ZESTRIL) 40 MG tablet -  continue amLODipine (NORVASC) 10 MG tablet - continue  HCTZ - 5.5 mos follow up (CPE)  Osteoporosis without current pathological fracture, unspecified osteoporosis type/Vitamin D deficiency -- dexa schedule next month.  - continue supplement.  - vit d UTD-yearly at cpe  Prediabetes: - a1c-  5.2.   Return in about 7 months (around 09/24/2021) for CPE (30 min).    No orders of the defined types were placed in this encounter.  No orders of the defined types were placed in this encounter.  Referral Orders  No referral(s) requested today     Electronically signed by: Howard Pouch, [place]

## 2021-03-12 ENCOUNTER — Telehealth (INDEPENDENT_AMBULATORY_CARE_PROVIDER_SITE_OTHER): Payer: PPO | Admitting: Family

## 2021-03-12 ENCOUNTER — Encounter: Payer: Self-pay | Admitting: Family

## 2021-03-12 ENCOUNTER — Other Ambulatory Visit: Payer: Self-pay

## 2021-03-12 VITALS — Ht 61.5 in | Wt 198.0 lb

## 2021-03-12 DIAGNOSIS — J209 Acute bronchitis, unspecified: Secondary | ICD-10-CM

## 2021-03-12 MED ORDER — AZITHROMYCIN 250 MG PO TABS: ORAL_TABLET | ORAL | 0 refills | Status: DC

## 2021-03-12 MED ORDER — BENZONATATE 100 MG PO CAPS: 100.0000 mg | ORAL_CAPSULE | Freq: Three times a day (TID) | ORAL | 0 refills | Status: DC | PRN

## 2021-03-12 NOTE — Addendum Note (Signed)
Addended by: Sherlene Shams on: 03/12/2021 11:23 AM   Modules accepted: Level of Service

## 2021-03-12 NOTE — Progress Notes (Addendum)
Meghan Vang is a 75 y.o. female with the following history as recorded in [employer]:  Patient Active Problem List   Diagnosis Date Noted  . Vitamin D deficiency 12/25/2019  . Elevated hemoglobin A1c 09/20/2018  . Osteoporosis 09/19/2017  . Obesity (BMI 30-39.9) 03/17/2017  . Encounter for general adult medical examination with abnormal findings 01/19/2016  . Essential hypertension, benign 01/18/2016    Current Outpatient Medications  Medication Sig Dispense Refill  . amLODipine (NORVASC) 10 MG tablet TAKE (1) TABLET BY MOUTH ONCE DAILY. 90 tablet 1  . azithromycin (ZITHROMAX) 250 MG tablet 2 tabs po qd x 1 day; 1 tablet per day x 4 days; 6 tablet 0  . benzonatate (TESSALON) 100 MG capsule Take 1 capsule (100 mg total) by mouth 3 (three) times daily as needed. 20 capsule 0  . hydrochlorothiazide (MICROZIDE) 12.5 MG capsule Take 1 capsule (12.5 mg total) by mouth daily. 90 capsule 1  . lisinopril (ZESTRIL) 40 MG tablet TAKE (1) TABLET BY MOUTH ONCE DAILY. 90 tablet 1  . Omega-3 Fatty Acids (FISH OIL) 1000 MG CPDR Take by mouth.    . vitamin C (ASCORBIC ACID) 500 MG tablet Take 500 mg by mouth daily.    . Vitamin D, Cholecalciferol, 25 MCG (1000 UT) CAPS Take 2 capsules by mouth daily. 2,000 units daily     No current facility-administered medications for this visit.    Allergies: Bactrim [sulfamethoxazole-trimethoprim]  Past Medical History:  Diagnosis Date  . Allergy   . Anemia   . Chicken pox   . Depression   . Hypertension   . Menopause     Past Surgical History:  Procedure Laterality Date  . SKIN BIOPSY    . TUBAL LIGATION      Family History  Problem Relation Age of Onset  . Colon cancer Father 78  . Diabetes Father   . Heart disease Father   . Asthma Mother   . Diabetes Mother   . Heart disease Mother   . Asthma Brother   . Heart disease Brother   . Liver cancer Maternal Grandmother   . Heart disease Maternal Grandfather   . Heart disease Paternal Grandmother    . Stroke Paternal Grandfather     Social History   Tobacco Use  . Smoking status: Never Smoker  . Smokeless tobacco: Never Used  Substance Use Topics  . Alcohol use: No    Alcohol/week: 0.0 standard drinks    Subjective:   I connected with [name] on 03/12/21 at 11:00 AM EDT by a telephone call and verified that I am speaking with the correct person using two identifiers.   I discussed the limitations of evaluation and management by telemedicine and the availability of in person appointments. The patient expressed understanding and agreed to proceed. Provider in office/ patient is at home; provider and patient are only 2 people on telephone call.   5 day history of cough/ congestion; started sneezing on Monday and developed cough on Tuesday; feels that cough has progressively worsened over the course of the week; no chest pain or shortness of breath; + low grade fever- 99/ 100; no concern for COVID exposure- has not taken COVID test;    Objective:  Vitals:   03/12/21 1046  Weight: 198 lb (89.8 kg)  Height: 5' 1.5" (1.562 m)    Lungs: Respirations unlabored;  Neurologic: Alert and oriented; speech intact; face symmetrical;   Assessment:  1. Acute bronchitis, unspecified organism     Plan:  ?  Allergic component; she defers COVID testing at this time/ not eligible for treatment due to 5 day window; will go ahead and start Z-pak/ Tessalon Perles; increase fluids, rest and follow up worse, no better; She will need negative COVID test to be seen in person and she expresses understanding.   Time spent 11 minutes  No follow-ups on file.  No orders of the defined types were placed in this encounter.   Requested Prescriptions   Signed Prescriptions Disp Refills  . azithromycin (ZITHROMAX) 250 MG tablet 6 tablet 0    Sig: 2 tabs po qd x 1 day; 1 tablet per day x 4 days;  . benzonatate (TESSALON) 100 MG capsule 20 capsule 0    Sig: Take 1 capsule (100 mg total) by mouth 3  (three) times daily as needed.

## 2021-04-06 ENCOUNTER — Other Ambulatory Visit: Payer: PPO

## 2021-04-06 ENCOUNTER — Ambulatory Visit: Payer: PPO

## 2021-04-19 ENCOUNTER — Other Ambulatory Visit: Payer: Self-pay | Admitting: Family Medicine

## 2021-06-01 ENCOUNTER — Ambulatory Visit
Admission: RE | Admit: 2021-06-01 | Discharge: 2021-06-01 | Disposition: A | Discharge status: Home / Self Care | Payer: PPO | Source: Ambulatory Visit | Attending: Family Medicine | Admitting: Family Medicine

## 2021-06-01 ENCOUNTER — Other Ambulatory Visit: Payer: Self-pay

## 2021-06-01 DIAGNOSIS — Z1231 Encounter for screening mammogram for malignant neoplasm of breast: Secondary | ICD-10-CM | POA: Diagnosis not present

## 2021-06-08 DIAGNOSIS — Z23 Encounter for immunization: Secondary | ICD-10-CM | POA: Diagnosis not present

## 2021-09-15 ENCOUNTER — Telehealth: Payer: Self-pay | Admitting: Family Medicine

## 2021-09-15 NOTE — Telephone Encounter (Signed)
LVM 09/15/21 AWV appt change time 8:15 to 8am please confirm-[id number]

## 2021-09-17 ENCOUNTER — Ambulatory Visit
Admission: RE | Admit: 2021-09-17 | Discharge: 2021-09-17 | Disposition: A | Discharge status: Home / Self Care | Payer: PPO | Source: Ambulatory Visit | Attending: Family Medicine | Admitting: Family Medicine

## 2021-09-17 ENCOUNTER — Other Ambulatory Visit: Payer: Self-pay

## 2021-09-17 DIAGNOSIS — E559 Vitamin D deficiency, unspecified: Secondary | ICD-10-CM

## 2021-09-17 DIAGNOSIS — M810 Age-related osteoporosis without current pathological fracture: Secondary | ICD-10-CM | POA: Diagnosis not present

## 2021-09-22 ENCOUNTER — Ambulatory Visit: Payer: PPO

## 2021-09-22 ENCOUNTER — Ambulatory Visit (INDEPENDENT_AMBULATORY_CARE_PROVIDER_SITE_OTHER): Payer: PPO

## 2021-09-22 ENCOUNTER — Other Ambulatory Visit: Payer: Self-pay

## 2021-09-22 DIAGNOSIS — Z0000 Encounter for general adult medical examination without abnormal findings: Secondary | ICD-10-CM | POA: Diagnosis not present

## 2021-09-22 NOTE — Patient Instructions (Addendum)
[name] , Thank you for taking time to come for your Medicare Wellness Visit. I appreciate your ongoing commitment to your health goals. Please review the following plan we discussed and let me know if I can assist you in the future.   Screening recommendations/referrals: Colonoscopy: Done 01/14/20 repeat every 3 years  Mammogram: Done 06/01/21 repeat every year  Bone Density: Done 09/17/21 repeat every 2 years  Recommended yearly ophthalmology/optometry visit for glaucoma screening and checkup Recommended yearly dental visit for hygiene and checkup  Vaccinations: Influenza vaccine: Due and discussed  Pneumococcal vaccine: Up to date Tdap vaccine: Done 01/19/16 repeat every 10 years  Shingles vaccine: Shingrix discussed. Please contact your pharmacy for coverage information.    Covid-19:Completed 2/4, 3/5, & 09/26/20  Advanced directives: Advance directive discussed with you today. I have provided a copy for you to complete at home and have notarized. Once this is complete please bring a copy in to our office so we can scan it into your chart.  Conditions/risks identified: Lose weight   Next appointment: Follow up in one year for your annual wellness visit    Preventive Care 65 Years and Older, Female Preventive care refers to lifestyle choices and visits with your health care provider that can promote health and wellness. What does preventive care include? A yearly physical exam. This is also called an annual well check. Dental exams once or twice a year. Routine eye exams. Ask your health care provider how often you should have your eyes checked. Personal lifestyle choices, including: Daily care of your teeth and gums. Regular physical activity. Eating a healthy diet. Avoiding tobacco and drug use. Limiting alcohol use. Practicing safe sex. Taking low-dose aspirin every day. Taking vitamin and mineral supplements as recommended by your health care provider. What happens during  an annual well check? The services and screenings done by your health care provider during your annual well check will depend on your age, overall health, lifestyle risk factors, and family history of disease. Counseling  Your health care provider may ask you questions about your: Alcohol use. Tobacco use. Drug use. Emotional well-being. Home and relationship well-being. Sexual activity. Eating habits. History of falls. Memory and ability to understand (cognition). Work and work Statistician. Reproductive health. Screening  You may have the following tests or measurements: Height, weight, and BMI. Blood pressure. Lipid and cholesterol levels. These may be checked every 5 years, or more frequently if you are over 63 years old. Skin check. Lung cancer screening. You may have this screening every year starting at age 1 if you have a 30-pack-year history of smoking and currently smoke or have quit within the past 15 years. Fecal occult blood test (FOBT) of the stool. You may have this test every year starting at age 27. Flexible sigmoidoscopy or colonoscopy. You may have a sigmoidoscopy every 5 years or a colonoscopy every 10 years starting at age 19. Hepatitis C blood test. Hepatitis B blood test. Sexually transmitted disease (STD) testing. Diabetes screening. This is done by checking your blood sugar (glucose) after you have not eaten for a while (fasting). You may have this done every 1-3 years. Bone density scan. This is done to screen for osteoporosis. You may have this done starting at age 63. Mammogram. This may be done every 1-2 years. Talk to your health care provider about how often you should have regular mammograms. Talk with your health care provider about your test results, treatment options, and if necessary, the need for  more tests. Vaccines  Your health care provider may recommend certain vaccines, such as: Influenza vaccine. This is recommended every year. Tetanus,  diphtheria, and acellular pertussis (Tdap, Td) vaccine. You may need a Td booster every 10 years. Zoster vaccine. You may need this after age 69. Pneumococcal 13-valent conjugate (PCV13) vaccine. One dose is recommended after age 75. Pneumococcal polysaccharide (PPSV23) vaccine. One dose is recommended after age 53. Talk to your health care provider about which screenings and vaccines you need and how often you need them. This information is not intended to replace advice given to you by your health care provider. Make sure you discuss any questions you have with your health care provider. Document Released: 11/27/2015 Document Revised: 07/20/2016 Document Reviewed: 09/01/2015 Elsevier Interactive Patient Education  2017 [place] Prevention in the Home Falls can cause injuries. They can happen to people of all ages. There are many things you can do to make your home safe and to help prevent falls. What can I do on the outside of my home? Regularly fix the edges of walkways and driveways and fix any cracks. Remove anything that might make you trip as you walk through a door, such as a raised step or threshold. Trim any bushes or trees on the path to your home. Use bright outdoor lighting. Clear any walking paths of anything that might make someone trip, such as rocks or tools. Regularly check to see if handrails are loose or broken. Make sure that both sides of any steps have handrails. Any raised decks and porches should have guardrails on the edges. Have any leaves, snow, or ice cleared regularly. Use sand or salt on walking paths during winter. Clean up any spills in your garage right away. This includes oil or grease spills. What can I do in the bathroom? Use night lights. Install grab bars by the toilet and in the tub and shower. Do not use towel bars as grab bars. Use non-skid mats or decals in the tub or shower. If you need to sit down in the shower, use a plastic,  non-slip stool. Keep the floor dry. Clean up any water that spills on the floor as soon as it happens. Remove soap buildup in the tub or shower regularly. Attach bath mats securely with double-sided non-slip rug tape. Do not have throw rugs and other things on the floor that can make you trip. What can I do in the bedroom? Use night lights. Make sure that you have a light by your bed that is easy to reach. Do not use any sheets or blankets that are too big for your bed. They should not hang down onto the floor. Have a firm chair that has side arms. You can use this for support while you get dressed. Do not have throw rugs and other things on the floor that can make you trip. What can I do in the kitchen? Clean up any spills right away. Avoid walking on wet floors. Keep items that you use a lot in easy-to-reach places. If you need to reach something above you, use a strong step stool that has a grab bar. Keep electrical cords out of the way. Do not use floor polish or wax that makes floors slippery. If you must use wax, use non-skid floor wax. Do not have throw rugs and other things on the floor that can make you trip. What can I do with my stairs? Do not leave any items on the stairs. Make  sure that there are handrails on both sides of the stairs and use them. Fix handrails that are broken or loose. Make sure that handrails are as long as the stairways. Check any carpeting to make sure that it is firmly attached to the stairs. Fix any carpet that is loose or worn. Avoid having throw rugs at the top or bottom of the stairs. If you do have throw rugs, attach them to the floor with carpet tape. Make sure that you have a light switch at the top of the stairs and the bottom of the stairs. If you do not have them, ask someone to add them for you. What else can I do to help prevent falls? Wear shoes that: Do not have high heels. Have rubber bottoms. Are comfortable and fit you well. Are closed  at the toe. Do not wear sandals. If you use a stepladder: Make sure that it is fully opened. Do not climb a closed stepladder. Make sure that both sides of the stepladder are locked into place. Ask someone to hold it for you, if possible. Clearly mark and make sure that you can see: Any grab bars or handrails. First and last steps. Where the edge of each step is. Use tools that help you move around (mobility aids) if they are needed. These include: Canes. Walkers. Scooters. Crutches. Turn on the lights when you go into a dark area. Replace any light bulbs as soon as they burn out. Set up your furniture so you have a clear path. Avoid moving your furniture around. If any of your floors are uneven, fix them. If there are any pets around you, be aware of where they are. Review your medicines with your doctor. Some medicines can make you feel dizzy. This can increase your chance of falling. Ask your doctor what other things that you can do to help prevent falls. This information is not intended to replace advice given to you by your health care provider. Make sure you discuss any questions you have with your health care provider. Document Released: 08/27/2009 Document Revised: 04/07/2016 Document Reviewed: 12/05/2014 Elsevier Interactive Patient Education  2017 [employer].

## 2021-09-22 NOTE — Progress Notes (Addendum)
Virtual Visit via Telephone Note  I connected with  Meghan Vang on 09/22/21 at  2:30 PM EST by telephone and verified that I am speaking with the correct person using two identifiers.  Medicare Annual Wellness visit completed telephonically due to Covid-19 pandemic.   Persons participating in this call: This Health Coach and this patient.   Location: Patient: Home Provider: Office   I discussed the limitations, risks, security and privacy concerns of performing an evaluation and management service by telephone and the availability of in person appointments. The patient expressed understanding and agreed to proceed.  Unable to perform video visit due to video visit attempted and failed and/or patient does not have video capability.   Some vital signs may be absent or patient reported.   Willette Brace, LPN   Subjective:   Meghan Vang is a 75 y.o. female who presents for Medicare Annual (Subsequent) preventive examination.  Review of Systems     Cardiac Risk Factors include: advanced age (>31men, >70 women);hypertension;sedentary lifestyle;obesity (BMI >30kg/m2)     Objective:    There were no vitals filed for this visit. There is no height or weight on file to calculate BMI.  Advanced Directives 09/22/2021 09/16/2020 03/17/2017  Does Patient Have a Medical Advance Directive? No No No  Would patient like information on creating a medical advance directive? Yes (MAU/Ambulatory/Procedural Areas - Information given) Yes (MAU/Ambulatory/Procedural Areas - Information given) Yes (MAU/Ambulatory/Procedural Areas - Information given)    Current Medications (verified) Outpatient Encounter Medications as of 09/22/2021  Medication Sig   amLODipine (NORVASC) 10 MG tablet TAKE (1) TABLET BY MOUTH ONCE DAILY.   BIOTIN PO Take by mouth.   hydrochlorothiazide (MICROZIDE) 12.5 MG capsule Take 1 capsule (12.5 mg total) by mouth daily.   lisinopril (ZESTRIL) 40 MG tablet TAKE (1) TABLET  BY MOUTH ONCE DAILY.   Omega-3 Fatty Acids (FISH OIL) 1000 MG CPDR Take by mouth.   vitamin C (ASCORBIC ACID) 500 MG tablet Take 500 mg by mouth daily.   Vitamin D, Cholecalciferol, 25 MCG (1000 UT) CAPS Take 2 capsules by mouth daily. 2,000 units daily   [DISCONTINUED] azithromycin (ZITHROMAX) 250 MG tablet 2 tabs po qd x 1 day; 1 tablet per day x 4 days;   [DISCONTINUED] benzonatate (TESSALON) 100 MG capsule Take 1 capsule (100 mg total) by mouth 3 (three) times daily as needed. (Patient not taking: Reported on 09/22/2021)   No facility-administered encounter medications on file as of 09/22/2021.    Allergies (verified) Bactrim [sulfamethoxazole-trimethoprim]   History: Past Medical History:  Diagnosis Date   Allergy    Anemia    Chicken pox    Depression    Hypertension    Menopause    Past Surgical History:  Procedure Laterality Date   SKIN BIOPSY     TUBAL LIGATION     Family History  Problem Relation Age of Onset   Colon cancer Father 11   Diabetes Father    Heart disease Father    Asthma Mother    Diabetes Mother    Heart disease Mother    Asthma Brother    Heart disease Brother    Liver cancer Maternal Grandmother    Heart disease Maternal Grandfather    Heart disease Paternal Grandmother    Stroke Paternal Grandfather    Social History   Socioeconomic History   Marital status: Single    Spouse name: Not on file   Number of children: Not on file   Years  of education: Not on file   Highest education level: Not on file  Occupational History   Not on file  Tobacco Use   Smoking status: Never   Smokeless tobacco: Never  Vaping Use   Vaping Use: Never used  Substance and Sexual Activity   Alcohol use: No    Alcohol/week: 0.0 standard drinks   Drug use: No   Sexual activity: Never  Other Topics Concern   Not on file  Social History Narrative   Divorced. 2 sons, 1 daughter. [name], [name]).   Some college. Retired. Moved from [place] to be next to  family.    Drinks caffeine.    Wears her seatbelt and bike helmet.    Smoke detector in the home.    Feels safe in her relationships.    Social Determinants of Health   Financial Resource Strain: Low Risk    Difficulty of Paying Living Expenses: Not hard at all  Food Insecurity: No Food Insecurity   Worried About Charity fundraiser in the Last Year: Never true   [place] in the Last Year: Never true  Transportation Needs: No Transportation Needs   Lack of Transportation (Medical): No   Lack of Transportation (Non-Medical): No  Physical Activity: Inactive   Days of Exercise per Week: 0 days   Minutes of Exercise per Session: 0 min  Stress: No Stress Concern Present   Feeling of Stress : Not at all  Social Connections: Socially Isolated   Frequency of Communication with Friends and Family: Twice a week   Frequency of Social Gatherings with Friends and Family: Twice a week   Attends Religious Services: Never   Marine scientist or Organizations: No   Attends Music therapist: Never   Marital Status: Divorced    Tobacco Counseling Counseling given: Not Answered   Clinical Intake:  Pre-visit preparation completed: Yes  Pain : No/denies pain     BMI - recorded: 36.81 Nutritional Status: BMI > 30  Obese Nutritional Risks: None Diabetes: No  How often do you need to have someone help you when you read instructions, pamphlets, or other written materials from your doctor or pharmacy?: 1 - Never  Diabetic?No  Interpreter Needed?: No  Information entered by :: Charlott Rakes, LPN   Activities of Daily Living In your present state of health, do you have any difficulty performing the following activities: 09/22/2021  Hearing? N  Vision? N  Difficulty concentrating or making decisions? N  Walking or climbing stairs? N  Dressing or bathing? N  Doing errands, shopping? N  Preparing Food and eating ? N  Using the Toilet? N  In the past six  months, have you accidently leaked urine? N  Do you have problems with loss of bowel control? N  Managing your Medications? N  Managing your Finances? N  Housekeeping or managing your Housekeeping? N  Some recent data might be hidden    Patient Care Team: Ma Hillock, DO as PCP - General (Family Medicine)  Indicate any recent Medical Services you may have received from other than [facility] providers in the past year (date may be approximate).     Assessment:   This is a routine wellness examination for Meghan Vang.  Hearing/Vision screen Hearing Screening - Comments:: Pt denies any hearing issues  Vision Screening - Comments:: Pt encouraged to follow up with a provider   Dietary issues and exercise activities discussed: Current Exercise Habits: The patient does not  participate in regular exercise at present   Goals Addressed             This Visit's Progress    Patient Stated       Working on losing weight        Depression Screen PHQ 2/9 Scores 09/22/2021 09/16/2020 09/23/2019 09/20/2018 09/19/2017 03/17/2017 08/16/2016  PHQ - 2 Score 0 0 0 0 0 0 0  PHQ- 9 Score - - - 0 - - -    Fall Risk Fall Risk  09/22/2021 03/12/2021 09/16/2020 09/20/2018 09/19/2017  Falls in the past year? 0 0 0 0 No  Number falls in past yr: 0 0 0 - -  Injury with Fall? 0 0 0 - -  Risk for fall due to : Impaired balance/gait No Fall Risks - - -  Follow up Falls prevention discussed Falls evaluation completed Falls prevention discussed Falls evaluation completed -    FALL RISK PREVENTION PERTAINING TO THE HOME:  Any stairs in or around the home? Yes  If so, are there any without handrails? No  Home free of loose throw rugs in walkways, pet beds, electrical cords, etc? Yes  Adequate lighting in your home to reduce risk of falls? Yes   ASSISTIVE DEVICES UTILIZED TO PREVENT FALLS:  Life alert? No  Use of a cane, walker or w/c? No  Grab bars in the bathroom? No  Shower chair or bench in shower? No   Elevated toilet seat or a handicapped toilet? No   TIMED UP AND GO:  Was the test performed? No .   Cognitive Function:     6CIT Screen 09/22/2021  What Year? 0 points  What month? 0 points  What time? 0 points  Count back from 20 0 points  Months in reverse 0 points  Repeat phrase 0 points  Total Score 0    Immunizations Immunization History  Administered Date(s) Administered   Fluad Quad(high Dose 65+) 08/12/2019, 09/23/2020   Influenza, High Dose Seasonal PF 09/20/2018   Moderna Sars-Covid-2 Vaccination 12/19/2019, 01/17/2020, 09/26/2020   Pneumococcal Conjugate-13 01/19/2016   Pneumococcal Polysaccharide-23 09/19/2017   Tdap 01/19/2016    TDAP status: Up to date  Flu Vaccine status: Due, Education has been provided regarding the importance of this vaccine. Advised may receive this vaccine at local pharmacy or Health Dept. Aware to provide a copy of the vaccination record if obtained from local pharmacy or Health Dept. Verbalized acceptance and understanding.  Pneumococcal vaccine status: Up to date  Covid-19 vaccine status: Completed vaccines  Qualifies for Shingles Vaccine? Yes   Zostavax completed No   Shingrix Completed?: No.    Education has been provided regarding the importance of this vaccine. Patient has been advised to call insurance company to determine out of pocket expense if they have not yet received this vaccine. Advised may also receive vaccine at local pharmacy or Health Dept. Verbalized acceptance and understanding.  Screening Tests Health Maintenance  Topic Date Due   Zoster Vaccines- Shingrix (1 of 2) Never done   COVID-19 Vaccine (4 - Booster for Moderna series) 11/21/2020   INFLUENZA VACCINE  06/14/2021   Fecal DNA (Cologuard)  01/14/2023   TETANUS/TDAP  01/18/2026   Pneumonia Vaccine 60+ Years old  Completed   DEXA SCAN  Completed   Hepatitis C Screening  Completed   HPV VACCINES  Aged Out    Health Maintenance  Health Maintenance  Due  Topic Date Due   Zoster Vaccines- Shingrix (1 of 2)  Never done   COVID-19 Vaccine (4 - Booster for Moderna series) 11/21/2020   INFLUENZA VACCINE  06/14/2021    Colorectal cancer screening: Type of screening: Cologuard. Completed 01/14/20. Repeat every 3 years  Mammogram status: Completed 06/01/21. Repeat every year  Bone Density status: Completed 09/17/21. Results reflect: Bone density results: OSTEOPOROSIS. Repeat every 2 years.  Additional Screening:  Hepatitis C Screening: Completed 08/16/16  Vision Screening: Recommended annual ophthalmology exams for early detection of glaucoma and other disorders of the eye. Is the patient up to date with their annual eye exam?  No  Who is the provider or what is the name of the office in which the patient attends annual eye exams? Encouraged to follow up with  If pt is not established with a provider, would they like to be referred to a provider to establish care? No .   Dental Screening: Recommended annual dental exams for proper oral hygiene  Community Resource Referral / Chronic Care Management: CRR required this visit?  No   CCM required this visit?  No      Plan:     I have personally reviewed and noted the following in the patient's chart:   Medical and social history Use of alcohol, tobacco or illicit drugs  Current medications and supplements including opioid prescriptions.  Functional ability and status Nutritional status Physical activity Advanced directives List of other physicians Hospitalizations, surgeries, and ER visits in previous 12 months Vitals Screenings to include cognitive, depression, and falls Referrals and appointments  In addition, I have reviewed and discussed with patient certain preventive protocols, quality metrics, and best practice recommendations. A written personalized care plan for preventive services as well as general preventive health recommendations were provided to patient.     Willette Brace, LPN   [id number]   Nurse Notes: None

## 2021-09-24 ENCOUNTER — Encounter: Payer: PPO | Admitting: Family Medicine

## 2021-09-28 DIAGNOSIS — Z23 Encounter for immunization: Secondary | ICD-10-CM | POA: Diagnosis not present

## 2021-10-18 ENCOUNTER — Other Ambulatory Visit: Payer: Self-pay | Admitting: Family Medicine

## 2021-11-02 ENCOUNTER — Other Ambulatory Visit: Payer: Self-pay

## 2021-11-02 ENCOUNTER — Encounter: Payer: Self-pay | Admitting: Family Medicine

## 2021-11-02 ENCOUNTER — Ambulatory Visit (INDEPENDENT_AMBULATORY_CARE_PROVIDER_SITE_OTHER): Payer: PPO | Admitting: Family Medicine

## 2021-11-02 VITALS — BP 119/73 | HR 91 | Temp 98.3°F | Ht 60.5 in | Wt 197.0 lb

## 2021-11-02 DIAGNOSIS — I10 Essential (primary) hypertension: Secondary | ICD-10-CM

## 2021-11-02 DIAGNOSIS — Z0000 Encounter for general adult medical examination without abnormal findings: Secondary | ICD-10-CM

## 2021-11-02 DIAGNOSIS — R7309 Other abnormal glucose: Secondary | ICD-10-CM | POA: Diagnosis not present

## 2021-11-02 DIAGNOSIS — E669 Obesity, unspecified: Secondary | ICD-10-CM

## 2021-11-02 DIAGNOSIS — E559 Vitamin D deficiency, unspecified: Secondary | ICD-10-CM | POA: Diagnosis not present

## 2021-11-02 DIAGNOSIS — M810 Age-related osteoporosis without current pathological fracture: Secondary | ICD-10-CM

## 2021-11-02 LAB — CBC
HCT: 41.4 % (ref 36.0–46.0)
Hemoglobin: 13.6 g/dL (ref 12.0–15.0)
MCHC: 32.9 g/dL (ref 30.0–36.0)
MCV: 90.7 fl (ref 78.0–100.0)
Platelets: 266 10*3/uL (ref 150.0–400.0)
RBC: 4.56 Mil/uL (ref 3.87–5.11)
RDW: 13.6 % (ref 11.5–15.5)
WBC: 7.3 10*3/uL (ref 4.0–10.5)

## 2021-11-02 LAB — COMPREHENSIVE METABOLIC PANEL
ALT: 12 U/L (ref 0–35)
AST: 17 U/L (ref 0–37)
Albumin: 4.3 g/dL (ref 3.5–5.2)
Alkaline Phosphatase: 54 U/L (ref 39–117)
BUN: 13 mg/dL (ref 6–23)
CO2: 31 mEq/L (ref 19–32)
Calcium: 10.1 mg/dL (ref 8.4–10.5)
Chloride: 100 mEq/L (ref 96–112)
Creatinine, Ser: 0.83 mg/dL (ref 0.40–1.20)
GFR: 68.9 mL/min (ref >60.00)
Glucose, Bld: 98 mg/dL (ref 70–99)
Potassium: 3.8 mEq/L (ref 3.5–5.1)
Sodium: 140 mEq/L (ref 135–145)
Total Bilirubin: 0.5 mg/dL (ref 0.2–1.2)
Total Protein: 7.5 g/dL (ref 6.0–8.3)

## 2021-11-02 LAB — VITAMIN D 25 HYDROXY (VIT D DEFICIENCY, FRACTURES): VITD: 33.77 ng/mL (ref 30.00–100.00)

## 2021-11-02 LAB — HEMOGLOBIN A1C: Hgb A1c MFr Bld: 5.9 % (ref 4.6–6.5)

## 2021-11-02 LAB — LIPID PANEL
Cholesterol: 160 mg/dL (ref 0–200)
HDL: 77.7 mg/dL (ref >39.00)
LDL Cholesterol: 59 mg/dL (ref 0–99)
NonHDL: 82.07
Total CHOL/HDL Ratio: 2
Triglycerides: 116 mg/dL (ref 0.0–149.0)
VLDL: 23.2 mg/dL (ref 0.0–40.0)

## 2021-11-02 LAB — TSH: TSH: 1.49 u[IU]/mL (ref 0.35–5.50)

## 2021-11-02 MED ORDER — HYDROCHLOROTHIAZIDE 12.5 MG PO CAPS: 12.5000 mg | ORAL_CAPSULE | Freq: Every day | ORAL | 1 refills | Status: DC

## 2021-11-02 MED ORDER — LISINOPRIL 40 MG PO TABS: ORAL_TABLET | ORAL | 1 refills | Status: DC

## 2021-11-02 MED ORDER — ZOSTER VAC RECOMB ADJUVANTED 50 MCG/0.5ML IM SUSR: 0.5000 mL | Freq: Once | INTRAMUSCULAR | 0 refills | Status: AC

## 2021-11-02 MED ORDER — AMLODIPINE BESYLATE 10 MG PO TABS: ORAL_TABLET | ORAL | 1 refills | Status: DC

## 2021-11-02 NOTE — Patient Instructions (Signed)
Great to see you today.  I have refilled the medication(s) we provide.   If labs were collected, we will inform you of lab results once received either by echart message or telephone call.   - echart message- for normal results that have been seen by the patient already.   - telephone call: abnormal results or if patient has not viewed results in their echart. Health Maintenance After Age 75 After age 74, you are at a higher risk for certain long-term diseases and infections as well as injuries from falls. Falls are a major cause of broken bones and head injuries in people who are older than age 29. Getting regular preventive care can help to keep you healthy and well. Preventive care includes getting regular testing and making lifestyle changes as recommended by your health care provider. Talk with your health care provider about: Which screenings and tests you should have. A screening is a test that checks for a disease when you have no symptoms. A diet and exercise plan that is right for you. What should I know about screenings and tests to prevent falls? Screening and testing are the best ways to find a health problem early. Early diagnosis and treatment give you the best chance of managing medical conditions that are common after age 71. Certain conditions and lifestyle choices may make you more likely to have a fall. Your health care provider may recommend: Regular vision checks. Poor vision and conditions such as cataracts can make you more likely to have a fall. If you wear glasses, make sure to get your prescription updated if your vision changes. Medicine review. Work with your health care provider to regularly review all of the medicines you are taking, including over-the-counter medicines. Ask your health care provider about any side effects that may make you more likely to have a fall. Tell your health care provider if any medicines that you take make you feel dizzy or sleepy. Strength  and balance checks. Your health care provider may recommend certain tests to check your strength and balance while standing, walking, or changing positions. Foot health exam. Foot pain and numbness, as well as not wearing proper footwear, can make you more likely to have a fall. Screenings, including: Osteoporosis screening. Osteoporosis is a condition that causes the bones to get weaker and break more easily. Blood pressure screening. Blood pressure changes and medicines to control blood pressure can make you feel dizzy. Depression screening. You may be more likely to have a fall if you have a fear of falling, feel depressed, or feel unable to do activities that you used to do. Alcohol use screening. Using too much alcohol can affect your balance and may make you more likely to have a fall. Follow these instructions at home: Lifestyle Do not drink alcohol if: Your health care provider tells you not to drink. If you drink alcohol: Limit how much you have to: 0-1 drink a day for women. 0-2 drinks a day for men. Know how much alcohol is in your drink. In the [place] one drink equals one 12 oz bottle of beer (355 mL), one 5 oz glass of wine (148 mL), or one 1 oz glass of hard liquor (44 mL). Do not use any products that contain nicotine or tobacco. These products include cigarettes, chewing tobacco, and vaping devices, such as e-cigarettes. If you need help quitting, ask your health care provider. Activity  Follow a regular exercise program to stay fit. This will help you maintain  your balance. Ask your health care provider what types of exercise are appropriate for you. If you need a cane or walker, use it as recommended by your health care provider. Wear supportive shoes that have nonskid soles. Safety  Remove any tripping hazards, such as rugs, cords, and clutter. Install safety equipment such as grab bars in bathrooms and safety rails on stairs. Keep rooms and walkways well-lit. General  instructions Talk with your health care provider about your risks for falling. Tell your health care provider if: You fall. Be sure to tell your health care provider about all falls, even ones that seem minor. You feel dizzy, tiredness (fatigue), or off-balance. Take over-the-counter and prescription medicines only as told by your health care provider. These include supplements. Eat a healthy diet and maintain a healthy weight. A healthy diet includes low-fat dairy products, low-fat (lean) meats, and fiber from whole grains, beans, and lots of fruits and vegetables. Stay current with your vaccines. Schedule regular health, dental, and eye exams. Summary Having a healthy lifestyle and getting preventive care can help to protect your health and wellness after age 63. Screening and testing are the best way to find a health problem early and help you avoid having a fall. Early diagnosis and treatment give you the best chance for managing medical conditions that are more common for people who are older than age 18. Falls are a major cause of broken bones and head injuries in people who are older than age 79. Take precautions to prevent a fall at home. Work with your health care provider to learn what changes you can make to improve your health and wellness and to prevent falls. This information is not intended to replace advice given to you by your health care provider. Make sure you discuss any questions you have with your health care provider. Document Revised: 03/22/2021 Document Reviewed: 03/22/2021 Elsevier Patient Education  [place].

## 2021-11-02 NOTE — Progress Notes (Signed)
This visit occurred during the SARS-CoV-2 public health emergency.  Safety protocols were in place, including screening questions prior to the visit, additional usage of staff PPE, and extensive cleaning of exam room while observing appropriate contact time as indicated for disinfecting solutions.    Patient ID: Meghan Vang, female  DOB: Aug 01, 1946, 75 y.o.   MRN: [id number] Patient Care Team    Relationship Specialty Notifications Start End  Ma Hillock, DO PCP - General Family Medicine  01/19/16     Chief Complaint  Patient presents with   Annual Exam    Pt is fasting    Subjective: Meghan Vang is a 75 y.o.  Female  present for CPE and cmc All past medical history, surgical history, allergies, family history, immunizations, medications and social history were updated in the electronic medical record today. All recent labs, ED visits and hospitalizations within the last year were reviewed.  Health maintenance:  Colonoscopy: Fhx in father. Cologuard neg 01/2020- NL Mammogram: No Fhx. Last mammogram. 06/01/2021 bc-gso Cervical cancer screening: not indicated. Immunizations: tdap UTD 01/19/2016, influenza vac [id number], PNA series completed 2018. Shingrix script provided last year and again today. Covid series completed. Infectious disease screening: hep c completed DEXA: osteoporosis. Vit d def. Dexa 09/2021 -2.5, rpt 2 years Assistive device: none Oxygen MIW:OEHO Patient has a Dental home. Hospitalizations/ED visits: reviewed  Hypertension/obesity: Pt reports complaince with lisinopril 40 and amlodipine 10 . Blood pressures ranges at home not routinely checked. Patient denies chest pain, shortness of breath, dizziness or lower extremity edema.  Pt does not take  daily baby ASA. Pt is not  prescribed statin. Diet: low sodium Exercise: routinely Labs UTD  RF: HTN, Fhx heart disease and stroke, Morbid obesity   elevated a1c: 5.9> 6.0>5.2 last visit. Weight is unchanged.    Depression screen [facility] 2/9 09/22/2021 09/16/2020 09/23/2019 09/20/2018 09/19/2017  Decreased Interest 0 0 0 0 0  Down, Depressed, Hopeless 0 0 0 0 0  PHQ - 2 Score 0 0 0 0 0  Altered sleeping - - - 0 -  Tired, decreased energy - - - 0 -  Change in appetite - - - 0 -  Feeling bad or failure about yourself  - - - 0 -  Trouble concentrating - - - 0 -  Suicidal thoughts - - - 0 -  PHQ-9 Score - - - 0 -  Difficult doing work/chores - - - Not difficult at all -   No flowsheet data found.         Immunization History  Administered Date(s) Administered   Fluad Quad(high Dose 65+) 08/12/2019, 09/23/2020   Influenza, High Dose Seasonal PF 09/20/2018   Influenza-Unspecified 09/28/2021   Moderna Covid-19 Vaccine Bivalent Booster 4yrs & up 09/28/2021   Moderna Sars-Covid-2 Vaccination 12/19/2019, 01/17/2020, 09/26/2020   Pneumococcal Conjugate-13 01/19/2016   Pneumococcal Polysaccharide-23 09/19/2017   Tdap 01/19/2016     Past Medical History:  Diagnosis Date   Allergy    Anemia    Chicken pox    Depression    Hypertension    Menopause    Allergies  Allergen Reactions   Bactrim [Sulfamethoxazole-Trimethoprim]    Past Surgical History:  Procedure Laterality Date   SKIN BIOPSY     TUBAL LIGATION     Family History  Problem Relation Age of Onset   Colon cancer Father 50   Diabetes Father    Heart disease Father    Asthma Mother    Diabetes Mother  Heart disease Mother    Asthma Brother    Heart disease Brother    Liver cancer Maternal Grandmother    Heart disease Maternal Grandfather    Heart disease Paternal Grandmother    Stroke Paternal Grandfather    Social History   Social History Narrative   Divorced. 2 sons, 1 daughter. Meghan Vang, Meghan Vang).   Some college. Retired. Moved from [place] to be next to family.    Drinks caffeine.    Wears her seatbelt and bike helmet.    Smoke detector in the home.    Feels safe in her relationships.     Allergies as of  11/02/2021       Reactions   Bactrim [sulfamethoxazole-trimethoprim]         Medication List        Accurate as of November 02, 2021  9:24 AM. If you have any questions, ask your nurse or doctor.          amLODipine 10 MG tablet Commonly known as: NORVASC TAKE (1) TABLET BY MOUTH ONCE DAILY.   BIOTIN PO Take by mouth.   Fish Oil 1000 MG Cpdr Take by mouth.   hydrochlorothiazide 12.5 MG capsule Commonly known as: MICROZIDE Take 1 capsule (12.5 mg total) by mouth daily.   lisinopril 40 MG tablet Commonly known as: ZESTRIL TAKE (1) TABLET BY MOUTH ONCE DAILY.   vitamin C 500 MG tablet Commonly known as: ASCORBIC ACID Take 500 mg by mouth daily.   Vitamin D (Cholecalciferol) 25 MCG (1000 UT) Caps Take 2 capsules by mouth daily. 2,000 units daily   Zoster Vaccine Adjuvanted injection Commonly known as: SHINGRIX Inject 0.5 mLs into the muscle once for 1 dose. Started by: Howard Pouch, DO        All past medical history, surgical history, allergies, family history, immunizations andmedications were updated in the EMR today and reviewed under the history and medication portions of their EMR.     No results found for this or any previous visit (from the past 2160 hour(s)).  DG Bone Density  Result Date: 09/17/2021 EXAM: DUAL X-RAY ABSORPTIOMETRY (DXA) FOR BONE MINERAL DENSITY IMPRESSION: Referring Physician:  Zerek Litsey A Bonny Egger Your patient completed a bone mineral density test using GE Lunar iDXA system (analysis version: 16). Technologist: [place] PATIENT: Name: Meghan, Vang Patient ID: [id number] Birth Date: Jul 29, 1946 Height: 61.0 in. Sex: Female Measured: 09/17/2021 Weight: 201.0 lbs. Indications: Advanced Age, Caucasian, Estrogen Deficient, Postmenopausal Fractures: None Treatments: Vitamin D (E933.5) ASSESSMENT: The BMD measured at Forearm Radius 33% is 0.666 g/cm2 with a T-score of -2.5. This patient is considered osteoporotic according to [place]  [facility]) criteria. The quality of the exam is good. The lumbar spine was excluded due to degenerative changes. Site Region Measured Date Measured Age YA BMD Significant CHANGE T-score DualFemur Neck Left 09/17/2021 75.4 -2.5 0.691 g/cm2 DualFemur Neck Left 10/09/2018 72.4 -2.5 0.688 g/cm2 DualFemur Total Mean 09/17/2021 75.4 -2.0 0.755 g/cm2 DualFemur Total Mean 10/09/2018 72.4 -2.1 0.739 g/cm2 Left Forearm Radius 33% 09/17/2021 75.4 -2.5 0.666 g/cm2 World Health Organization [facility]) criteria for post-menopausal, Caucasian Women: Normal      ROS 14 pt review of systems performed and negative (unless mentioned in an HPI)  Objective:  BP 119/73    Pulse 91    Temp 98.3 F (36.8 C) (Oral)    Ht 5' 0.5" (1.537 m)    Wt 197 lb (89.4 kg)    SpO2 96%    BMI 37.84 kg/m  Physical Exam Vitals and nursing note reviewed.  Constitutional:      General: She is not in acute distress.    Appearance: Normal appearance. She is not ill-appearing or toxic-appearing.  HENT:     Head: Normocephalic and atraumatic.     Right Ear: Tympanic membrane, ear canal and external ear normal. There is no impacted cerumen.     Left Ear: Tympanic membrane, ear canal and external ear normal. There is no impacted cerumen.     Nose: No congestion or rhinorrhea.     Mouth/Throat:     Mouth: Mucous membranes are moist.     Pharynx: Oropharynx is clear. No oropharyngeal exudate or posterior oropharyngeal erythema.  Eyes:     General: No scleral icterus.       Right eye: No discharge.        Left eye: No discharge.     Extraocular Movements: Extraocular movements intact.     Conjunctiva/sclera: Conjunctivae normal.     Pupils: Pupils are equal, round, and reactive to light.  Cardiovascular:     Rate and Rhythm: Normal rate and regular rhythm.     Pulses: Normal pulses.     Heart sounds: Normal heart sounds. No murmur heard.   No friction rub. No gallop.  Pulmonary:     Effort: Pulmonary effort is normal. No respiratory  distress.     Breath sounds: Normal breath sounds. No stridor. No wheezing, rhonchi or rales.  Chest:     Chest wall: No tenderness.  Abdominal:     General: Abdomen is flat. Bowel sounds are normal. There is no distension.     Palpations: Abdomen is soft. There is no mass.     Tenderness: There is no abdominal tenderness. There is no right CVA tenderness, left CVA tenderness, guarding or rebound.     Hernia: No hernia is present.  Musculoskeletal:        General: No swelling, tenderness or deformity. Normal range of motion.     Cervical back: Normal range of motion and neck supple. No rigidity or tenderness.     Right lower leg: No edema.     Left lower leg: No edema.  Lymphadenopathy:     Cervical: No cervical adenopathy.  Skin:    General: Skin is warm and dry.     Coloration: Skin is not jaundiced or pale.     Findings: No bruising, erythema, lesion or rash.  Neurological:     General: No focal deficit present.     Mental Status: She is alert and oriented to person, place, and time. Mental status is at baseline.     Cranial Nerves: No cranial nerve deficit.     Sensory: No sensory deficit.     Motor: No weakness.     Coordination: Coordination normal.     Gait: Gait normal.     Deep Tendon Reflexes: Reflexes normal.  Psychiatric:        Mood and Affect: Mood normal.        Behavior: Behavior normal.        Thought Content: Thought content normal.        Judgment: Judgment normal.     No results found.  Assessment/plan: Rickeya Manus is a 75 y.o. female present for CPE/CMC Essential hypertension, benign/ Obesity (BMI 30-39.9) stable -  continue lisinopril (ZESTRIL) 40 MG tablet -  continue  amLODipine (NORVASC) 10 MG tablet - continue  HCTZ - CBC - Comprehensive metabolic panel - Lipid panel -  TSH - 5.5 mos follow up (CPE)   Osteoporosis without current pathological fracture, unspecified osteoporosis type/Vitamin D deficiency -- dexa UTD 2022 - continue vit d  supplement.  - vit d collected today   Prediabetes: - a1c-  5.2.> a1c collected today - Hemoglobin A1c  Routine general medical examination at a health care facility Colonoscopy: Fhx in father. Cologuard neg 01/2020- NL Mammogram: No Fhx. Last mammogram. 06/01/2021 bc-gso (desires 18 mos intervals)  Cervical cancer screening: not indicated. Immunizations: tdap UTD 01/19/2016, influenza vac [id number], PNA series completed 2018. Shingrix script provided last year and again today. Covid series completed. Infectious disease screening: hep c completed DEXA: osteoporosis. Vit d def. Dexa 09/2021 -2.5, rpt 2 years Patient was encouraged to exercise greater than 150 minutes a week. Patient was encouraged to choose a diet filled with fresh fruits and vegetables, and lean meats. AVS provided to patient today for education/recommendation on gender specific health and safety maintenance.  Return in about 24 weeks (around 04/19/2022) for CMC (30 min).  Orders Placed This Encounter  Procedures   CBC   Comprehensive metabolic panel   Hemoglobin A1c   Lipid panel   TSH   VITAMIN D 25 Hydroxy (Vit-D Deficiency, Fractures)   Meds ordered this encounter  Medications   amLODipine (NORVASC) 10 MG tablet    Sig: TAKE (1) TABLET BY MOUTH ONCE DAILY.    Dispense:  90 tablet    Refill:  1   hydrochlorothiazide (MICROZIDE) 12.5 MG capsule    Sig: Take 1 capsule (12.5 mg total) by mouth daily.    Dispense:  90 capsule    Refill:  1   lisinopril (ZESTRIL) 40 MG tablet    Sig: TAKE (1) TABLET BY MOUTH ONCE DAILY.    Dispense:  90 tablet    Refill:  1   Zoster Vaccine Adjuvanted [facility]) injection    Sig: Inject 0.5 mLs into the muscle once for 1 dose.    Dispense:  0.5 mL    Refill:  0   Referral Orders  No referral(s) requested today     Electronically signed by: Howard Pouch, [place]

## 2021-11-29 ENCOUNTER — Other Ambulatory Visit: Payer: Self-pay | Admitting: Family Medicine

## 2021-12-31 ENCOUNTER — Other Ambulatory Visit: Payer: Self-pay | Admitting: Physician Assistant

## 2021-12-31 ENCOUNTER — Telehealth: Payer: Self-pay | Admitting: Family Medicine

## 2021-12-31 ENCOUNTER — Telehealth (INDEPENDENT_AMBULATORY_CARE_PROVIDER_SITE_OTHER): Payer: PPO | Admitting: Physician Assistant

## 2021-12-31 ENCOUNTER — Other Ambulatory Visit: Payer: Self-pay

## 2021-12-31 VITALS — Temp 100.7°F

## 2021-12-31 DIAGNOSIS — U071 COVID-19: Secondary | ICD-10-CM | POA: Diagnosis not present

## 2021-12-31 MED ORDER — NIRMATRELVIR/RITONAVIR (PAXLOVID)TABLET: 3.0000 | ORAL_TABLET | Freq: Two times a day (BID) | ORAL | 0 refills | Status: DC

## 2021-12-31 MED ORDER — NIRMATRELVIR/RITONAVIR (PAXLOVID)TABLET: 3.0000 | ORAL_TABLET | Freq: Two times a day (BID) | ORAL | 0 refills | Status: AC

## 2021-12-31 NOTE — Telephone Encounter (Signed)
Please see message. °

## 2021-12-31 NOTE — Progress Notes (Deleted)
Virtual Visit via Video   I connected with Meghan Vang on 12/31/21 at  4:00 PM EST by a video enabled telemedicine application and verified that I am speaking with the correct person using two identifiers. Location patient: Home Location provider: [facility], Office Persons participating in the virtual visit: Meghan Vang, Meghan Coke PA-C,   I discussed the limitations of evaluation and management by telemedicine and the availability of in person appointments. The patient expressed understanding and agreed to proceed.  SCRIBE ATTESTATION  Subjective:   HPI:   Patient is requesting evaluation for possible COVID-19.  Symptom onset: ***  Travel/contacts: ***   Vaccination status: ***  Testing results: Positive home COVID test today  Patient endorses the following symptoms: ***  Patient denies the following symptoms: ***  Treatments tried: {Treatments; cough:31099}  Patient risk factors: Current ONGEX-52 risk of complications score: *** Smoking status: Meghan Vang  reports that she has never smoked. She has never used smokeless tobacco. If female, currently pregnant? []   Yes []   No  ROS: See pertinent positives and negatives per HPI.  Patient Active Problem List   Diagnosis Date Noted   Vitamin D deficiency 12/25/2019   Elevated hemoglobin A1c 09/20/2018   Osteoporosis 09/19/2017   Obesity (BMI 30-39.9) 03/17/2017   Essential hypertension, benign 01/18/2016    Social History   Tobacco Use   Smoking status: Never   Smokeless tobacco: Never  Substance Use Topics   Alcohol use: No    Alcohol/week: 0.0 standard drinks    Current Outpatient Medications:    amLODipine (NORVASC) 10 MG tablet, TAKE (1) TABLET BY MOUTH ONCE DAILY., Disp: 90 tablet, Rfl: 1   BIOTIN PO, Take by mouth., Disp: , Rfl:    hydrochlorothiazide (MICROZIDE) 12.5 MG capsule, Take 1 capsule (12.5 mg total) by mouth daily., Disp: 90 capsule, Rfl: 1   lisinopril (ZESTRIL) 40 MG  tablet, TAKE (1) TABLET BY MOUTH ONCE DAILY., Disp: 90 tablet, Rfl: 1   Omega-3 Fatty Acids (FISH OIL) 1000 MG CPDR, Take by mouth., Disp: , Rfl:    vitamin C (ASCORBIC ACID) 500 MG tablet, Take 500 mg by mouth daily., Disp: , Rfl:    Vitamin D, Cholecalciferol, 25 MCG (1000 UT) CAPS, Take 2 capsules by mouth daily. 2,000 units daily, Disp: , Rfl:   Allergies  Allergen Reactions   Bactrim [Sulfamethoxazole-Trimethoprim]     Objective:   VITALS: Per patient if applicable, see vitals. GENERAL: Alert, appears well and in no acute distress. HEENT: Atraumatic, conjunctiva clear, no obvious abnormalities on inspection of external nose and ears. NECK: Normal movements of the head and neck. CARDIOPULMONARY: No increased WOB. Speaking in clear sentences. I:E ratio WNL.  MS: Moves all visible extremities without noticeable abnormality. PSYCH: Pleasant and cooperative, well-groomed. Speech normal rate and rhythm. Affect is appropriate. Insight and judgement are appropriate. Attention is focused, linear, and appropriate.  NEURO: CN grossly intact. Oriented as arrived to appointment on time with no prompting. Moves both UE equally.  SKIN: No obvious lesions, wounds, erythema, or cyanosis noted on face or hands.  Assessment and Plan:   There are no diagnoses linked to this encounter.  No red flags on discussion, patient is not in any obvious distress during our visit. Discussed progression of most viral illnesses, and recommended supportive care at this point in time.  I did however provide pocket rx for oral *** should symptoms not improve as anticipated. Discussed over the counter supportive care options, including Tylenol 500 mg  q 8 hours, with recommendations to push fluids and rest. Reviewed return precautions including new/worsening fever, SOB, new/worsening cough, sudden onset changes of symptoms. Recommended need to self-quarantine and practice social distancing until symptoms resolve. I  recommend that patient follow-up if symptoms worsen or persist despite treatment x 7-10 days, sooner if needed.  I discussed the assessment and treatment plan with the patient. The patient was provided an opportunity to ask questions and all were answered. The patient agreed with the plan and demonstrated an understanding of the instructions.   The patient was advised to call back or seek an in-person evaluation if the symptoms worsen or if the condition fails to improve as anticipated.   ***  Anselmo Pickler, LPN 05/10/349

## 2021-12-31 NOTE — Progress Notes (Signed)
Virtual Visit via Video   I connected with Meghan Vang on 12/31/21 at  4:00 PM EST by a video enabled telemedicine application and verified that I am speaking with the correct person using two identifiers. Location patient: Home Location provider: [facility], Office Persons participating in the virtual visit: Jeliyah Ramires, Inda Coke PA-C  I discussed the limitations of evaluation and management by telemedicine and the availability of in person appointments. The patient expressed understanding and agreed to proceed.  Subjective:   HPI:   Patient is requesting evaluation for possible COVID-19.  Symptom onset: worsening Tuesday/Wednesday  Travel/contacts: none   Vaccination status: full vaccinated and boosted  Testing results: positive -- home today  Patient endorses the following symptoms: In January had a little pressure in her sinuses and a tickle in her throat. She started Coricidin. Symptoms improved. Did have a residual cough.   On Tuesday, she developed sudden onset of runny nose, worsening cough. She developed a fever of 101.4 on Wednesday. She took tylenol and this helped. She has a cough.   Her son has coffee with her every morning. She thinks that he may have shared something with her.  Patient denies the following symptoms: Denies severe congestion in her lungs and shortness of breath, chest pain, n/v/d.  No hx of asthma or COPD.  Treatments tried: Coricidin, tylenol  Patient risk factors: Current PNTIR-44 risk of complications score: 4 Smoking status: Addysyn Rosenberry  reports that she has never smoked. She has never used smokeless tobacco. If female, currently pregnant? []   Yes []   No  ROS: See pertinent positives and negatives per HPI.  Patient Active Problem List   Diagnosis Date Noted   Vitamin D deficiency 12/25/2019   Elevated hemoglobin A1c 09/20/2018   Osteoporosis 09/19/2017   Obesity (BMI 30-39.9) 03/17/2017   Essential  hypertension, benign 01/18/2016    Social History   Tobacco Use   Smoking status: Never   Smokeless tobacco: Never  Substance Use Topics   Alcohol use: No    Alcohol/week: 0.0 standard drinks    Current Outpatient Medications:    amLODipine (NORVASC) 10 MG tablet, TAKE (1) TABLET BY MOUTH ONCE DAILY., Disp: 90 tablet, Rfl: 1   BIOTIN PO, Take by mouth., Disp: , Rfl:    hydrochlorothiazide (MICROZIDE) 12.5 MG capsule, Take 1 capsule (12.5 mg total) by mouth daily., Disp: 90 capsule, Rfl: 1   lisinopril (ZESTRIL) 40 MG tablet, TAKE (1) TABLET BY MOUTH ONCE DAILY., Disp: 90 tablet, Rfl: 1   Omega-3 Fatty Acids (FISH OIL) 1000 MG CPDR, Take by mouth., Disp: , Rfl:    vitamin C (ASCORBIC ACID) 500 MG tablet, Take 500 mg by mouth daily., Disp: , Rfl:    Vitamin D, Cholecalciferol, 25 MCG (1000 UT) CAPS, Take 2 capsules by mouth daily. 2,000 units daily, Disp: , Rfl:   Allergies  Allergen Reactions   Bactrim [Sulfamethoxazole-Trimethoprim]     Objective:   VITALS: Per patient if applicable, see vitals. GENERAL: Alert, appears well and in no acute distress. HEENT: Atraumatic, conjunctiva clear, no obvious abnormalities on inspection of external nose and ears. NECK: Normal movements of the head and neck. CARDIOPULMONARY: No increased WOB. Speaking in clear sentences. I:E ratio WNL.  MS: Moves all visible extremities without noticeable abnormality. PSYCH: Pleasant and cooperative, well-groomed. Speech normal rate and rhythm. Affect is appropriate. Insight and judgement are appropriate. Attention is focused, linear, and appropriate.  NEURO: CN grossly intact. Oriented as arrived to appointment on time  with no prompting. Moves both UE equally.  SKIN: No obvious lesions, wounds, erythema, or cyanosis noted on face or hands.  Assessment and Plan:   [name] was seen today for covid positive.  Diagnoses and all orders for this visit:  COVID-19   No red flags on discussion, patient is  not in any obvious distress during our visit. Discussed progression of most viral illnesses, and recommended supportive care at this point in time.   We are going to send in paxlovid for her to start. She verbalized understanding of this medication side effects and risks/benefits.  Discussed over the counter supportive care options, including Tylenol 500 mg q 8 hours, with recommendations to push fluids and rest. Reviewed return precautions including new/worsening fever, SOB, new/worsening cough, sudden onset changes of symptoms. Recommended need to self-quarantine and practice social distancing until symptoms resolve. I recommend that patient follow-up if symptoms worsen or persist despite treatment x 7-10 days, sooner if needed.  I discussed the assessment and treatment plan with the patient. The patient was provided an opportunity to ask questions and all were answered. The patient agreed with the plan and demonstrated an understanding of the instructions.   The patient was advised to call back or seek an in-person evaluation if the symptoms worsen or if the condition fails to improve as anticipated.   [place], [place] 12/31/2021

## 2021-12-31 NOTE — Telephone Encounter (Signed)
Pt states her pharmacy does not have their medication in stock and wants it ordered at another pharmacy.   MEDICATION: nirmatrelvir/ritonavir EUA (PAXLOVID) 20 x 150 MG & 10 x 100MG  TABS  PHARMACY:  [facility] [phone], [place], [place], [place]  Phone: [phone]

## 2021-12-31 NOTE — Telephone Encounter (Signed)
Spoke to pt told her Rx was sent to new pharmacy as requested. Pt verbalized understanding.

## 2021-12-31 NOTE — Telephone Encounter (Signed)
Note sent in error

## 2022-01-17 ENCOUNTER — Other Ambulatory Visit: Payer: Self-pay | Admitting: Family Medicine

## 2022-02-21 NOTE — Telephone Encounter (Signed)
ERROR

## 2022-04-18 ENCOUNTER — Telehealth: Payer: Self-pay

## 2022-04-18 NOTE — Telephone Encounter (Signed)
Pt rescheduled for 6/12.  [facility] - Client Nonclinical Telephone Record  [facility] Client [facility] - Client Client Site [place] - Day Provider Raoul Pitch, [place] Type Call Who Is Calling Patient / Member / Family / Caregiver Caller Name [place] Phone Number [phone] Patient Name Meghan Vang Patient DOB 08/06/1946 Call Type Message Only Information Provided Reason for Call Request to Reschedule Office Appointment Initial Comment Caller states she has an appointment tomorrow and needs to reschedule . Disp. Time Disposition Final User 04/18/2022 7:29:39 AM General Information Provided Yes Vinnie Level Call Closed By: Vinnie Level Transaction Date/Time: 04/18/2022 7:27:03 AM (ET)

## 2022-04-19 ENCOUNTER — Ambulatory Visit: Payer: PPO | Admitting: Family Medicine

## 2022-04-25 ENCOUNTER — Encounter: Payer: Self-pay | Admitting: Family Medicine

## 2022-04-25 ENCOUNTER — Ambulatory Visit (INDEPENDENT_AMBULATORY_CARE_PROVIDER_SITE_OTHER): Payer: PPO | Admitting: Family Medicine

## 2022-04-25 VITALS — BP 118/72 | HR 76 | Temp 98.1°F | Ht 60.5 in | Wt 195.0 lb

## 2022-04-25 DIAGNOSIS — M810 Age-related osteoporosis without current pathological fracture: Secondary | ICD-10-CM | POA: Diagnosis not present

## 2022-04-25 DIAGNOSIS — E559 Vitamin D deficiency, unspecified: Secondary | ICD-10-CM | POA: Diagnosis not present

## 2022-04-25 DIAGNOSIS — R7309 Other abnormal glucose: Secondary | ICD-10-CM

## 2022-04-25 DIAGNOSIS — E669 Obesity, unspecified: Secondary | ICD-10-CM

## 2022-04-25 DIAGNOSIS — I10 Essential (primary) hypertension: Secondary | ICD-10-CM

## 2022-04-25 LAB — POCT GLYCOSYLATED HEMOGLOBIN (HGB A1C)
HbA1c POC (<> result, manual entry): 5.6 % (ref 4.0–5.6)
HbA1c, POC (controlled diabetic range): 5.6 % (ref 0.0–7.0)
HbA1c, POC (prediabetic range): 5.6 % — AB (ref 5.7–6.4)
Hemoglobin A1C: 5.6 % (ref 4.0–5.6)

## 2022-04-25 MED ORDER — HYDROCHLOROTHIAZIDE 12.5 MG PO CAPS
12.5000 mg | ORAL_CAPSULE | Freq: Every day | ORAL | 2 refills | Status: DC
Start: 2022-04-25 — End: 2022-11-10

## 2022-04-25 MED ORDER — AMLODIPINE BESYLATE 10 MG PO TABS
ORAL_TABLET | ORAL | 2 refills | Status: DC
Start: 2022-04-25 — End: 2022-11-10

## 2022-04-25 MED ORDER — LISINOPRIL 40 MG PO TABS
ORAL_TABLET | ORAL | 2 refills | Status: DC
Start: 2022-04-25 — End: 2022-11-10

## 2022-04-25 NOTE — Progress Notes (Signed)
Patient ID: Meghan Vang, female  DOB: Mar 10, 1946, 76 y.o.   MRN: [id number] Patient Care Team    Relationship Specialty Notifications Start End  Ma Hillock, DO PCP - General Family Medicine  01/19/16     Chief Complaint  Patient presents with   Hypertension    [place]; pt is not fasting    Subjective: Meghan Vang is a 76 y.o.  Female  present for cmc All past medical history, surgical history, allergies, family history, immunizations, medications and social history were updatedin the electronic medical record today. All recent labs, ED visits and hospitalizations within the last year were reviewed.   Hypertension/obesity: Pt reports compliance with lisinopril 40, amlodipine 10 and HCTZ 12.5 mg qd. Blood pressures ranges at home not routinely checked. Patient denies chest pain, shortness of breath, dizziness or lower extremity edema.   Pt does not take  daily baby ASA. Pt is not  prescribed statin. Diet: low sodium Exercise: routinely Labs UTD  RF: HTN, Fhx heart disease and stroke, Morbid obesity   elevated a1c: 5.9> 6.0>5.2>5.9 last visit. Weight is better at 195     09/22/2021    2:37 PM 09/16/2020    8:27 AM 09/23/2019    8:06 AM 09/20/2018    8:12 AM 09/19/2017    8:10 AM  Depression screen PHQ 2/9  Decreased Interest 0 0 0 0 0  Down, Depressed, Hopeless 0 0 0 0 0  PHQ - 2 Score 0 0 0 0 0  Altered sleeping    0   Tired, decreased energy    0   Change in appetite    0   Feeling bad or failure about yourself     0   Trouble concentrating    0   Suicidal thoughts    0   PHQ-9 Score    0   Difficult doing work/chores    Not difficult at all        No data to display          Immunization History  Administered Date(s) Administered   Fluad Quad(high Dose 65+) 08/12/2019, 09/23/2020   Influenza, High Dose Seasonal PF 09/20/2018   Influenza-Unspecified 09/28/2021   Moderna Covid-19 Vaccine Bivalent Booster 47yr & up 09/28/2021   Moderna Sars-Covid-2  Vaccination 12/19/2019, 01/17/2020, 09/26/2020   Pneumococcal Conjugate-13 01/19/2016   Pneumococcal Polysaccharide-23 09/19/2017   Tdap 01/19/2016     Past Medical History:  Diagnosis Date   Allergy    Anemia    Chicken pox    COVID    Depression    Hypertension    Menopause    Allergies  Allergen Reactions   Bactrim [Sulfamethoxazole-Trimethoprim]    Past Surgical History:  Procedure Laterality Date   SKIN BIOPSY     TUBAL LIGATION     Family History  Problem Relation Age of Onset   Colon cancer Father 655  Diabetes Father    Heart disease Father    Asthma Mother    Diabetes Mother    Heart disease Mother    Asthma Brother    Heart disease Brother    Liver cancer Maternal Grandmother    Heart disease Maternal Grandfather    Heart disease Paternal Grandmother    Stroke Paternal Grandfather    Social History   Social History Narrative   Divorced. 2 sons, 1 daughter. ([name] TFonnie Birkenhead.   Some college. Retired. Moved from [place] to be next to family.  Drinks caffeine.    Wears her seatbelt and bike helmet.    Smoke detector in the home.    Feels safe in her relationships.     Allergies as of 04/25/2022       Reactions   Bactrim [sulfamethoxazole-trimethoprim]         Medication List        Accurate as of April 25, 2022  8:30 AM. If you have any questions, ask your nurse or doctor.          amLODipine 10 MG tablet Commonly known as: NORVASC TAKE (1) TABLET BY MOUTH ONCE DAILY.   BIOTIN PO Take by mouth.   Fish Oil 1000 MG Cpdr Take by mouth.   hydrochlorothiazide 12.5 MG capsule Commonly known as: MICROZIDE Take 1 capsule (12.5 mg total) by mouth daily.   lisinopril 40 MG tablet Commonly known as: ZESTRIL TAKE (1) TABLET BY MOUTH ONCE DAILY.   vitamin C 500 MG tablet Commonly known as: ASCORBIC ACID Take 500 mg by mouth daily.   Vitamin D (Cholecalciferol) 25 MCG (1000 UT) Caps Take 2 capsules by mouth daily. 2,000 units  daily        All past medical history, surgical history, allergies, family history, immunizations andmedications were updated in the EMR today and reviewed under the history and medication portions of their EMR.     Recent Results (from the past 2160 hour(s))  POCT HgB A1C     Status: Abnormal   Collection Time: 04/25/22  8:21 AM  Result Value Ref Range   Hemoglobin A1C 5.6 4.0 - 5.6 %   HbA1c POC (<> result, manual entry) 5.6 4.0 - 5.6 %   HbA1c, POC (prediabetic range) 5.6 (A) 5.7 - 6.4 %   HbA1c, POC (controlled diabetic range) 5.6 0.0 - 7.0 %    DG Bone Density  Result Date: 09/17/2021 EXAM: DUAL X-RAY ABSORPTIOMETRY (DXA) FOR BONE MINERAL DENSITY IMPRESSION: Referring Physician:  [place] Your patient completed a bone mineral density test using GE Lunar iDXA system (analysis version: 16). Technologist: [place] PATIENT: Name: Meghan, Vang Patient ID: [id number] Birth Date: [birth year] Height: 61.0 in. Sex: Female Measured: 09/17/2021 Weight: 201.0 lbs. Indications: Advanced Age, Caucasian, Estrogen Deficient, Postmenopausal Fractures: None Treatments: Vitamin D (E933.5) ASSESSMENT: The BMD measured at Forearm Radius 33% is 0.666 g/cm2 with a T-score of -2.5. This patient is considered osteoporotic according to [facility]) criteria. The quality of the exam is good. The lumbar spine was excluded due to degenerative changes. Site Region Measured Date Measured Age YA BMD Significant CHANGE T-score DualFemur Neck Left 09/17/2021 75.4 -2.5 0.691 g/cm2 DualFemur Neck Left 10/09/2018 72.4 -2.5 0.688 g/cm2 DualFemur Total Mean 09/17/2021 75.4 -2.0 0.755 g/cm2 DualFemur Total Mean 10/09/2018 72.4 -2.1 0.739 g/cm2 Left Forearm Radius 33% 09/17/2021 75.4 -2.5 0.666 g/cm2 World Health Organization [facility]) criteria for post-menopausal, Caucasian Women: Normal      ROS 14 pt review of systems performed and negative (unless mentioned in an HPI)  Objective: BP 118/72   Pulse 76    Temp 98.1 F (36.7 C) (Oral)   Ht 5' 0.5" (1.537 m)   Wt 195 lb (88.5 kg)   SpO2 97%   BMI 37.46 kg/m   Physical Exam Vitals and nursing note reviewed.  Constitutional:      General: She is not in acute distress.    Appearance: Normal appearance. She is not ill-appearing, toxic-appearing or diaphoretic.  HENT:     Head: Normocephalic and atraumatic.  Eyes:     General: No scleral icterus.       Right eye: No discharge.        Left eye: No discharge.     Extraocular Movements: Extraocular movements intact.     Conjunctiva/sclera: Conjunctivae normal.     Pupils: Pupils are equal, round, and reactive to light.  Cardiovascular:     Rate and Rhythm: Normal rate and regular rhythm.  Pulmonary:     Effort: Pulmonary effort is normal. No respiratory distress.     Breath sounds: Normal breath sounds. No wheezing, rhonchi or rales.  Musculoskeletal:     Cervical back: Neck supple. No tenderness.     Right lower leg: No edema.     Left lower leg: No edema.  Lymphadenopathy:     Cervical: No cervical adenopathy.  Skin:    General: Skin is warm and dry.     Coloration: Skin is not jaundiced or pale.     Findings: No erythema or rash.  Neurological:     Mental Status: She is alert and oriented to person, place, and time. Mental status is at baseline.     Motor: No weakness.     Gait: Gait normal.  Psychiatric:        Mood and Affect: Mood normal.        Behavior: Behavior normal.        Thought Content: Thought content normal.        Judgment: Judgment normal.     No results found.  Assessment/plan: Meghan Vang is a 76 y.o. female present for [facility] Essential hypertension, benign/ Obesity (BMI 30-39.9) Stable Continue  lisinopril (ZESTRIL) 40 MG tablet -  continue amLODipine (NORVASC) 10 MG tablet - continue  HCTZ - 5.5 mos follow up (CPE)   Osteoporosis without current pathological fracture, unspecified osteoporosis type/Vitamin D deficiency -- dexa UTD 2022 -  continue vit d supplement.  - vit d UTD 10/2021   Prediabetes: - a1c-  5.2.> 5.9>5.6 today  Return in about 6 months (around 11/03/2022) for cpe (20 min), Routine chronic condition follow-up.  Orders Placed This Encounter  Procedures   POCT HgB A1C   Meds ordered this encounter  Medications   amLODipine (NORVASC) 10 MG tablet    Sig: TAKE (1) TABLET BY MOUTH ONCE DAILY.    Dispense:  90 tablet    Refill:  2   hydrochlorothiazide (MICROZIDE) 12.5 MG capsule    Sig: Take 1 capsule (12.5 mg total) by mouth daily.    Dispense:  90 capsule    Refill:  2   lisinopril (ZESTRIL) 40 MG tablet    Sig: TAKE (1) TABLET BY MOUTH ONCE DAILY.    Dispense:  90 tablet    Refill:  2   Referral Orders  No referral(s) requested today     Electronically signed by: Howard Pouch, [place]

## 2022-05-12 IMAGING — MG MM DIGITAL SCREENING BILAT W/ TOMO AND CAD
8 series · 8 of 24 positions shown · non-contrast
Comparison: Previous exam(s).

CLINICAL DATA: Screening.

EXAM:
DIGITAL SCREENING BILATERAL MAMMOGRAM WITH TOMOSYNTHESIS AND CAD
TECHNIQUE: Bilateral screening digital craniocaudal and mediolateral oblique
mammograms were obtained. Bilateral screening digital breast
tomosynthesis was performed. The images were evaluated with
computer-aided detection.

[L CC synth-2D]
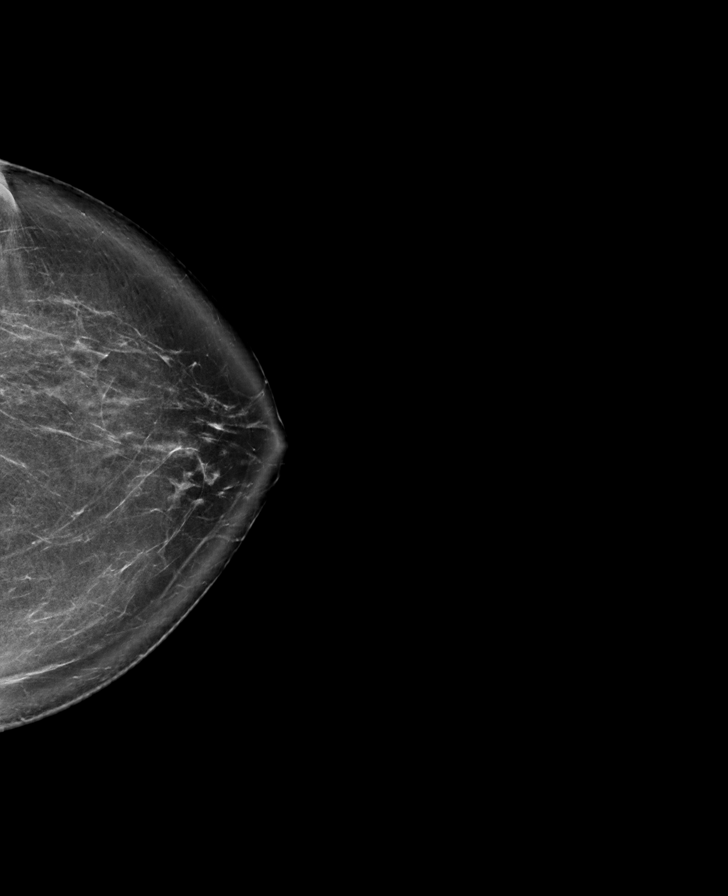

[R MLO synth-2D]
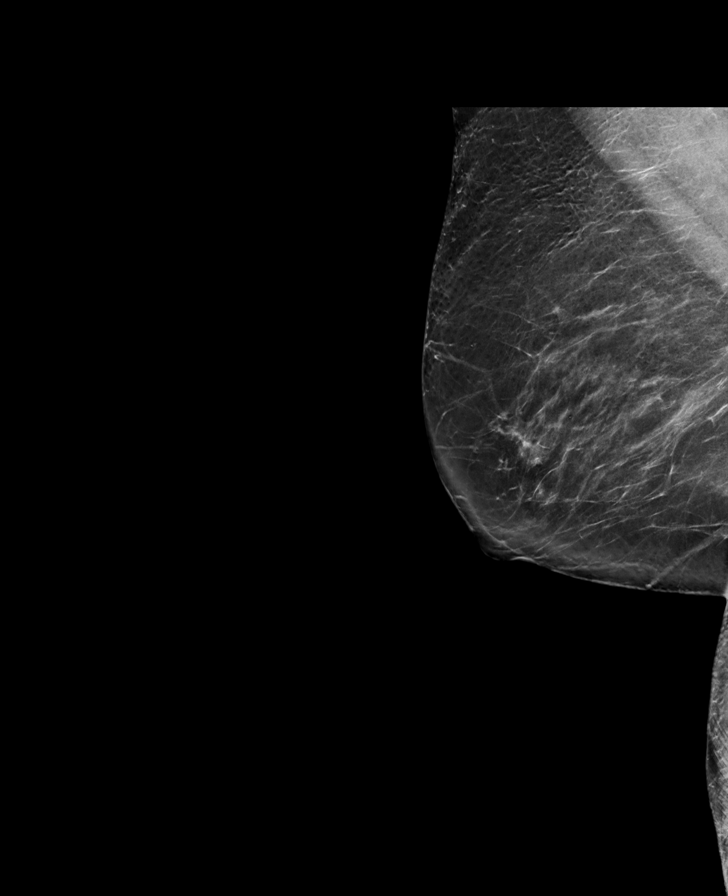

[L MLO synth-2D]
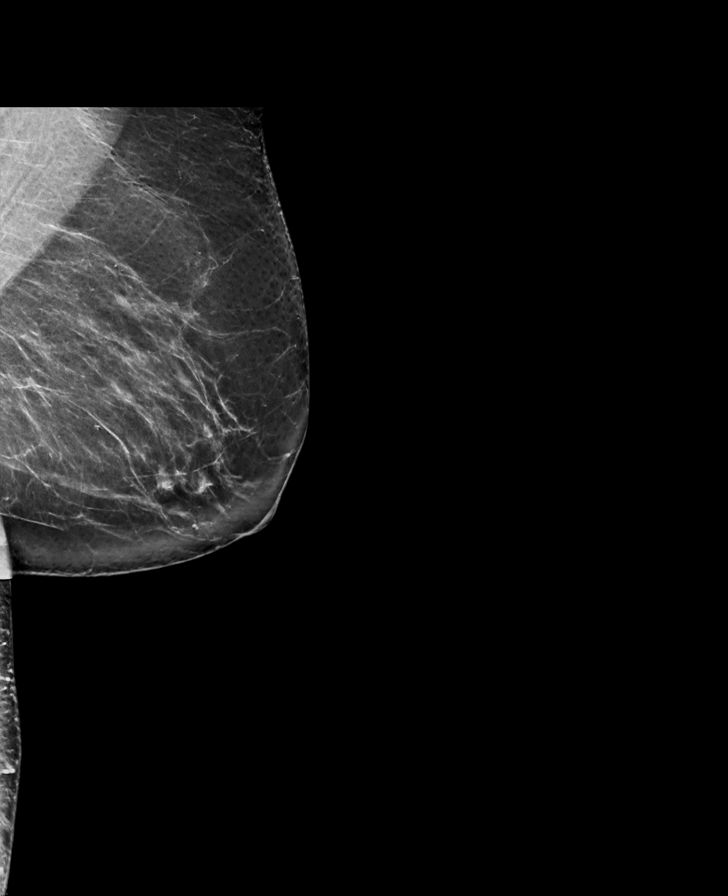

[R CC synth-2D]
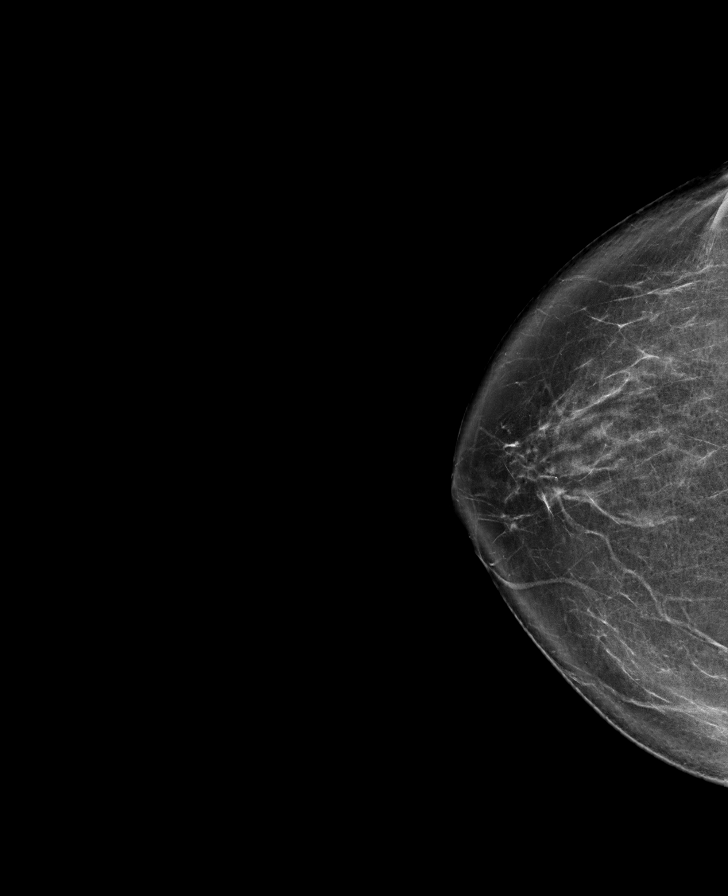

[L CC tomo · tomo slice 44/87.0]
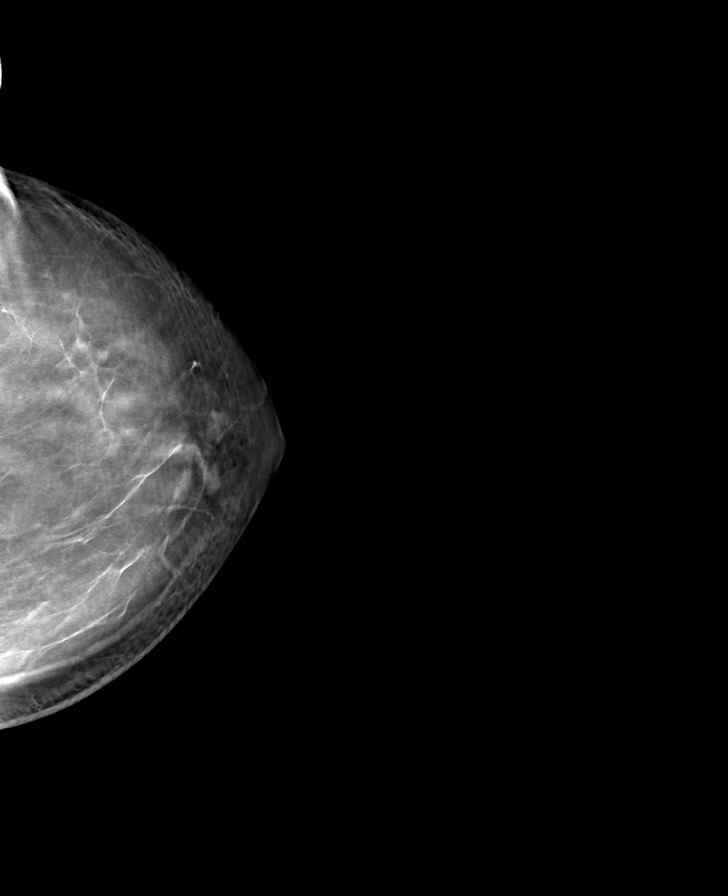

[R MLO tomo · tomo slice 43/84.0]
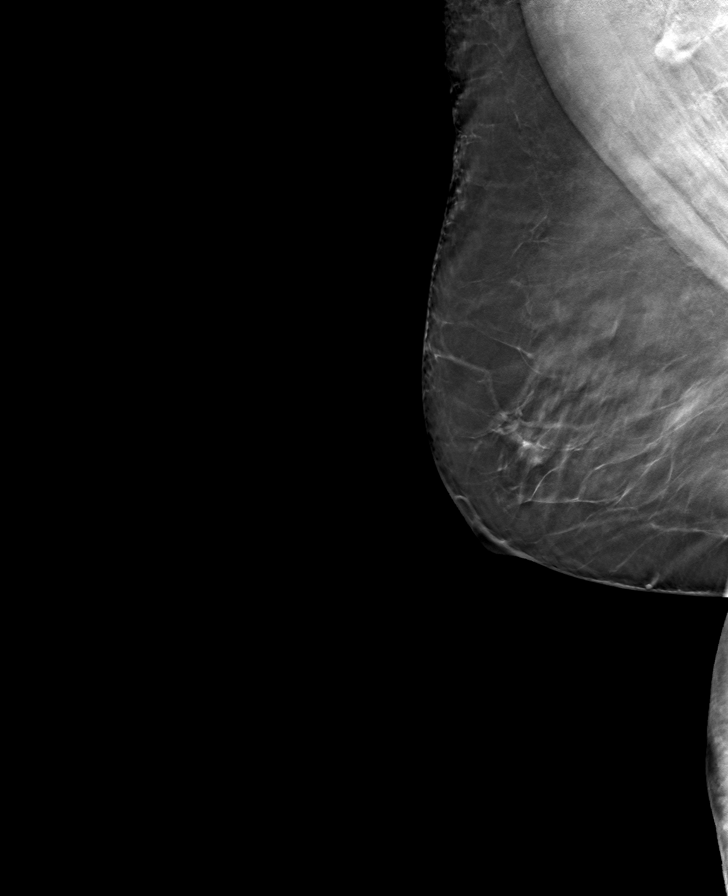

[R CC tomo · tomo slice 41/82.0]
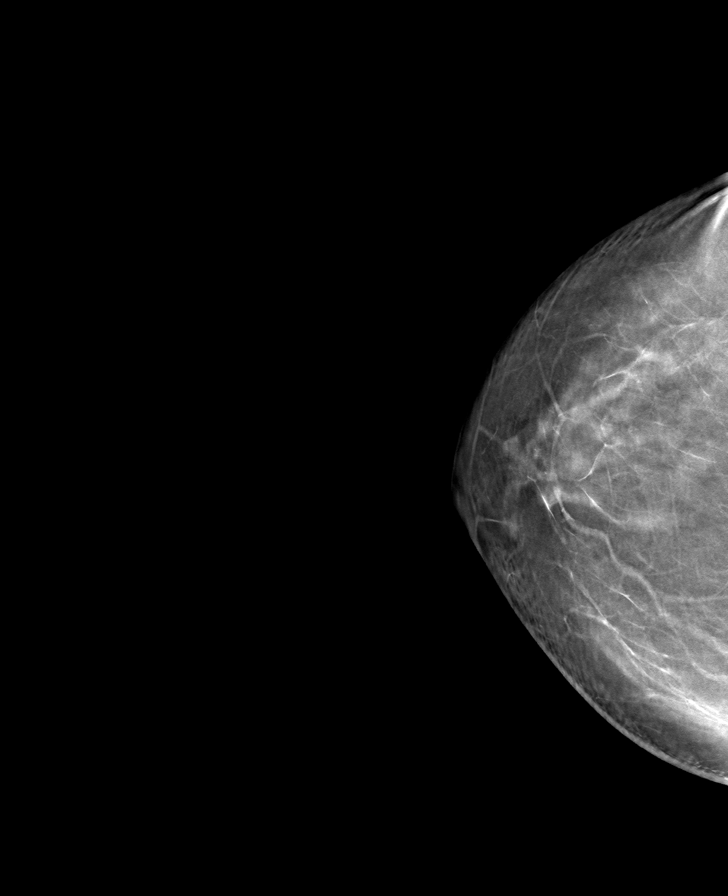

[L MLO tomo · tomo slice 45/90.0]
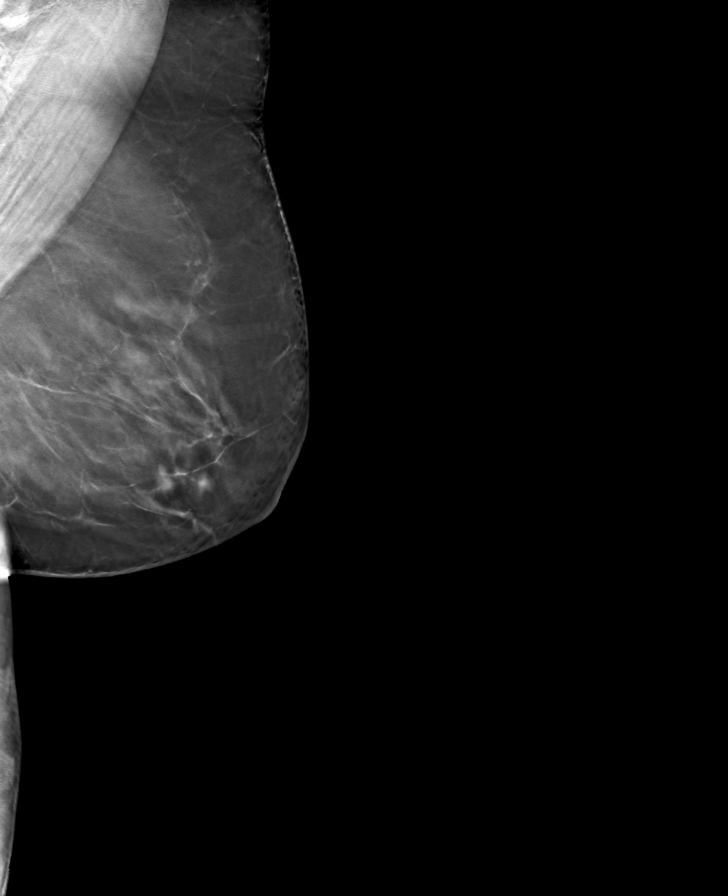

[8 of 24 positions shown; findings below may reference images not displayed]

ACR Breast Density Category b: There are scattered areas of
fibroglandular density.
FINDINGS: There are no findings suspicious for malignancy.
IMPRESSION: No mammographic evidence of malignancy. A result letter of this
screening mammogram will be mailed directly to the patient.

RECOMMENDATION:
Screening mammogram in one year. (Code:51-O-LD2)

BI-RADS CATEGORY  1: Negative.

## 2022-05-30 ENCOUNTER — Other Ambulatory Visit: Payer: Self-pay | Admitting: Family Medicine

## 2022-07-18 ENCOUNTER — Other Ambulatory Visit: Payer: Self-pay | Admitting: Family Medicine

## 2022-07-28 ENCOUNTER — Telehealth: Payer: Self-pay

## 2022-07-28 NOTE — Telephone Encounter (Signed)
Called pt to schedule AWV. Please schedule with health coach or Lorriane Shire. Due 09/23/22

## 2022-08-04 DIAGNOSIS — Z20822 Contact with and (suspected) exposure to covid-19: Secondary | ICD-10-CM | POA: Diagnosis not present

## 2022-08-05 DIAGNOSIS — Z20822 Contact with and (suspected) exposure to covid-19: Secondary | ICD-10-CM | POA: Diagnosis not present

## 2022-08-09 DIAGNOSIS — Z20822 Contact with and (suspected) exposure to covid-19: Secondary | ICD-10-CM | POA: Diagnosis not present

## 2022-08-10 DIAGNOSIS — Z20822 Contact with and (suspected) exposure to covid-19: Secondary | ICD-10-CM | POA: Diagnosis not present

## 2022-08-13 DIAGNOSIS — Z20822 Contact with and (suspected) exposure to covid-19: Secondary | ICD-10-CM | POA: Diagnosis not present

## 2022-08-14 DIAGNOSIS — Z20822 Contact with and (suspected) exposure to covid-19: Secondary | ICD-10-CM | POA: Diagnosis not present

## 2022-08-17 DIAGNOSIS — Z20822 Contact with and (suspected) exposure to covid-19: Secondary | ICD-10-CM | POA: Diagnosis not present

## 2022-08-18 DIAGNOSIS — Z20822 Contact with and (suspected) exposure to covid-19: Secondary | ICD-10-CM | POA: Diagnosis not present

## 2022-09-01 DIAGNOSIS — Z20822 Contact with and (suspected) exposure to covid-19: Secondary | ICD-10-CM | POA: Diagnosis not present

## 2022-09-02 DIAGNOSIS — Z20822 Contact with and (suspected) exposure to covid-19: Secondary | ICD-10-CM | POA: Diagnosis not present

## 2022-09-28 ENCOUNTER — Ambulatory Visit: Payer: PPO

## 2022-10-05 ENCOUNTER — Ambulatory Visit: Payer: PPO

## 2022-10-11 ENCOUNTER — Telehealth: Payer: Self-pay | Admitting: Family Medicine

## 2022-10-11 NOTE — Telephone Encounter (Signed)
Copied from [place] #[id number]. Topic: Medicare AWV >> Oct 11, 2022  9:25 AM Devoria Glassing wrote: Reason for CRM: Called patient 10/11/22, LVM AWV time changed from 10:30am to 1:00pm Wed Nov 29th. Please confirm appt change- khc

## 2022-10-12 ENCOUNTER — Ambulatory Visit (INDEPENDENT_AMBULATORY_CARE_PROVIDER_SITE_OTHER): Payer: PPO

## 2022-10-12 DIAGNOSIS — Z0000 Encounter for general adult medical examination without abnormal findings: Secondary | ICD-10-CM | POA: Diagnosis not present

## 2022-10-12 NOTE — Progress Notes (Signed)
Subjective:   Meghan Vang is a 76 y.o. female who presents for Medicare Annual (Subsequent) preventive examination.   I connected with  Ma Grassia on 10/12/22 by an audio only telemedicine application and verified that I am speaking with the correct person using two identifiers.   I discussed the limitations, risks, security and privacy concerns of performing an evaluation and management service by telephone and the availability of in person appointments. I also discussed with the patient that there may be a patient responsible charge related to this service. The patient expressed understanding and verbally consented to this telephonic visit.  Location of Patient: home  Location of Provider: office  List any persons and their role that are participating in the visit with the patient.   [place], CMA  Review of Systems    Defer to PCP Cardiac Risk Factors include: advanced age (>1mn, >>92women);sedentary lifestyle;hypertension;obesity (BMI >30kg/m2)     Objective:    There were no vitals filed for this visit. There is no height or weight on file to calculate BMI.     10/12/2022    1:38 PM 09/22/2021    2:42 PM 09/16/2020    8:22 AM 03/17/2017    8:59 AM  Advanced Directives  Does Patient Have a Medical Advance Directive? No No No No  Would patient like information on creating a medical advance directive? No - Patient declined Yes (MAU/Ambulatory/Procedural Areas - Information given) Yes (MAU/Ambulatory/Procedural Areas - Information given) Yes (MAU/Ambulatory/Procedural Areas - Information given)    Current Medications (verified) Outpatient Encounter Medications as of 10/12/2022  Medication Sig   amLODipine (NORVASC) 10 MG tablet TAKE (1) TABLET BY MOUTH ONCE DAILY.   BIOTIN PO Take by mouth.   hydrochlorothiazide (MICROZIDE) 12.5 MG capsule Take 1 capsule (12.5 mg total) by mouth daily.   lisinopril (ZESTRIL) 40 MG tablet TAKE (1) TABLET BY  MOUTH ONCE DAILY.   Omega-3 Fatty Acids (FISH OIL) 1000 MG CPDR Take by mouth.   vitamin C (ASCORBIC ACID) 500 MG tablet Take 500 mg by mouth daily.   Vitamin D, Cholecalciferol, 25 MCG (1000 UT) CAPS Take 2 capsules by mouth daily. 2,000 units daily   No facility-administered encounter medications on file as of 10/12/2022.    Allergies (verified) Bactrim [sulfamethoxazole-trimethoprim]   History: Past Medical History:  Diagnosis Date   Allergy    Anemia    Chicken pox    COVID    Depression    Hypertension    Menopause    Past Surgical History:  Procedure Laterality Date   SKIN BIOPSY     TUBAL LIGATION     Family History  Problem Relation Age of Onset   Colon cancer Father 657  Diabetes Father    Heart disease Father    Asthma Mother    Diabetes Mother    Heart disease Mother    Asthma Brother    Heart disease Brother    Liver cancer Maternal Grandmother    Heart disease Maternal Grandfather    Heart disease Paternal Grandmother    Stroke Paternal Grandfather    Social History   Socioeconomic History   Marital status: Single    Spouse name: Not on file   Number of children: Not on file   Years of education: Not on file   Highest education level: Not on file  Occupational History   Not on file  Tobacco Use   Smoking status: Never   Smokeless tobacco: Never  Vaping Use   Vaping Use: Never used  Substance and Sexual Activity   Alcohol use: No    Alcohol/week: 0.0 standard drinks of alcohol   Drug use: No   Sexual activity: Never  Other Topics Concern   Not on file  Social History Narrative   Divorced. 2 sons, 1 daughter. [name], [name]).   Some college. Retired. Moved from [place] to be next to family.    Drinks caffeine.    Wears her seatbelt and bike helmet.    Smoke detector in the home.    Feels safe in her relationships.    Social Determinants of Health   Financial Resource Strain: Low Risk  (10/12/2022)   Overall Financial Resource  Strain (CARDIA)    Difficulty of Paying Living Expenses: Not hard at all  Food Insecurity: No Food Insecurity (10/12/2022)   Hunger Vital Sign    Worried About Running Out of Food in the Last Year: Never true    Ran Out of Food in the Last Year: Never true  Transportation Needs: No Transportation Needs (10/12/2022)   PRAPARE - Hydrologist (Medical): No    Lack of Transportation (Non-Medical): No  Physical Activity: Insufficiently Active (10/12/2022)   Exercise Vital Sign    Days of Exercise per Week: 3 days    Minutes of Exercise per Session: 20 min  Stress: No Stress Concern Present (10/12/2022)   [place]    Feeling of Stress : Not at all  Social Connections: Socially Isolated (10/12/2022)   Social Connection and Isolation Panel [NHANES]    Frequency of Communication with Friends and Family: Three times a week    Frequency of Social Gatherings with Friends and Family: Twice a week    Attends Religious Services: Never    Marine scientist or Organizations: No    Attends Music therapist: Never    Marital Status: Divorced    Tobacco Counseling Counseling given: Not Answered   Clinical Intake:  Pre-visit preparation completed: No  Pain : No/denies pain     Nutritional Status: BMI > 30  Obese Nutritional Risks: None Diabetes: No  How often do you need to have someone help you when you read instructions, pamphlets, or other written materials from your doctor or pharmacy?: 1 - Never  Diabetic?no  Interpreter Needed?: No      Activities of Daily Living    10/12/2022    1:38 PM  In your present state of health, do you have any difficulty performing the following activities:  Hearing? 0  Vision? 0  Difficulty concentrating or making decisions? 0  Walking or climbing stairs? 0  Dressing or bathing? 0  Doing errands, shopping? 0  Preparing Food and  eating ? N  Using the Toilet? N  In the past six months, have you accidently leaked urine? N  Do you have problems with loss of bowel control? N  Managing your Medications? N  Managing your Finances? N  Housekeeping or managing your Housekeeping? N    Patient Care Team: Ma Hillock, DO as PCP - General (Family Medicine)  Indicate any recent Medical Services you may have received from other than [facility] providers in the past year (date may be approximate).     Assessment:   This is a routine wellness examination for Meghan Vang.  Hearing/Vision screen No results found.  Dietary issues and exercise activities discussed: Current Exercise Habits: Home  exercise routine, Type of exercise: walking, Time (Minutes): 20, Frequency (Times/Week): 3, Weekly Exercise (Minutes/Week): 60, Intensity: Mild, Exercise limited by: None identified   Goals Addressed             This Visit's Progress    Quality of Life Maintained       Evidence-based guidance:  Assess patient's thoughts about quality of life, goals and expectations, and dissatisfaction or desire to improve.  Identify issues of primary importance such as mental health, illness, exercise tolerance, pain, sexual function and intimacy, cognitive change, social isolation, finances and relationships.  Assess and monitor for signs/symptoms of psychosocial concerns, especially depression or ideations regarding harm to others or self; provide or refer for mental health services as needed.  Identify sensory issues that impact quality of life such as hearing loss, vision deficit; strategize ways to maintain or improve hearing, vision.  Promote access to services in the community to support independence such as support groups, home visiting programs, financial assistance, handicapped parking tags, durable medical equipment and emergency responder.  Promote activities to decrease social isolation such as group support or social, leisure and  recreational activities, employment, use of social media; consider safety concerns about being out of home for activities.  Provide patient an opportunity to share by storytelling or a "life review" to give positive meaning to life and to assist with coping and negative experiences.  Encourage patient to tap into hope to improve sense of self.  Counsel based on prognosis and as early as possible about end-of-life and palliative care; consider referral to palliative care provider.  Advocate for the development of palliative care plan that may include avoidance of unnecessary testing and intervention, symptom control, discontinuation of medications, hospice and organ donation.  Counsel as early as possible those with life-limiting chronic disease about palliative care; consider referral to palliative care provider.  Advocate for the development of palliative care plan.   Notes:       Depression Screen    10/12/2022    1:36 PM 09/22/2021    2:37 PM 09/16/2020    8:27 AM 09/23/2019    8:06 AM 09/20/2018    8:12 AM 09/19/2017    8:10 AM 03/17/2017    9:03 AM  PHQ 2/9 Scores  PHQ - 2 Score 0 0 0 0 0 0 0  PHQ- 9 Score     0      Fall Risk    10/12/2022    1:38 PM 09/22/2021    2:45 PM 03/12/2021   10:45 AM 09/16/2020    8:26 AM 09/20/2018    8:12 AM  [place] in the past year? 0 0 0 0 0  Number falls in past yr: 0 0 0 0   Injury with Fall? 0 0 0 0   Risk for fall due to :  Impaired balance/gait No Fall Risks    Follow up Falls evaluation completed Falls prevention discussed Falls evaluation completed Falls prevention discussed Falls evaluation completed    FALL RISK PREVENTION PERTAINING TO THE HOME:  Any stairs in or around the home? Yes  If so, are there any without handrails? No  Home free of loose throw rugs in walkways, pet beds, electrical cords, etc? Yes  Adequate lighting in your home to reduce risk of falls? Yes   ASSISTIVE DEVICES UTILIZED TO PREVENT FALLS:  Life  alert? No  Use of a cane, walker or w/c? No  Grab bars in the bathroom? No  Shower chair or bench in shower? No  Elevated toilet seat or a handicapped toilet? No   TIMED UP AND GO:  Was the test performed? No .  Length of time to ambulate 10 feet: n/a sec.     Cognitive Function:        10/12/2022    1:39 PM 09/22/2021    2:47 PM  6CIT Screen  What Year? 0 points 0 points  What month? 0 points 0 points  What time? 0 points 0 points  Count back from 20 0 points 0 points  Months in reverse 0 points 0 points  Repeat phrase 0 points 0 points  Total Score 0 points 0 points    Immunizations Immunization History  Administered Date(s) Administered   Fluad Quad(high Dose 65+) 08/12/2019, 09/23/2020   Influenza, High Dose Seasonal PF 09/20/2018   Influenza-Unspecified 09/28/2021   Moderna Covid-19 Vaccine Bivalent Booster 76yr & up 09/28/2021   Moderna Sars-Covid-2 Vaccination 12/19/2019, 01/17/2020, 09/26/2020   Pneumococcal Conjugate-13 01/19/2016   Pneumococcal Polysaccharide-23 09/19/2017   Tdap 01/19/2016    TDAP status: Up to date  Flu Vaccine status: Due, Education has been provided regarding the importance of this vaccine. Advised may receive this vaccine at local pharmacy or Health Dept. Aware to provide a copy of the vaccination record if obtained from local pharmacy or Health Dept. Verbalized acceptance and understanding.  Pneumococcal vaccine status: Up to date  Covid-19 vaccine status: Completed vaccines  Qualifies for Shingles Vaccine? Yes   Zostavax completed No   Shingrix Completed?: No.    Education has been provided regarding the importance of this vaccine. Patient has been advised to call insurance company to determine out of pocket expense if they have not yet received this vaccine. Advised may also receive vaccine at local pharmacy or Health Dept. Verbalized acceptance and understanding.  Screening Tests Health Maintenance  Topic Date Due    MAMMOGRAM  06/01/2022   INFLUENZA VACCINE  06/14/2022   COVID-19 Vaccine (5 - 2023-24 season) 07/15/2022   Zoster Vaccines- Shingrix (1 of 2) 07/26/2024 (Originally 04/18/1996)   Medicare Annual Wellness (AWV)  10/13/2023   Pneumonia Vaccine 76 Years old  Completed   DEXA SCAN  Completed   Hepatitis C Screening  Completed   HPV VACCINES  Aged Out   Fecal DNA (Cologuard)  Discontinued    Health Maintenance  Health Maintenance Due  Topic Date Due   MAMMOGRAM  06/01/2022   INFLUENZA VACCINE  06/14/2022   COVID-19 Vaccine (5 - 2023-24 season) 07/15/2022    Colorectal cancer screening: No longer required.   Mammogram status: Completed 06/01/2021. Repeat every year  Bone Density status: Completed 09/17/2021. Results reflect: Bone density results: OSTEOPOROSIS. Repeat every 2 years.  Lung Cancer Screening: (Low Dose CT Chest recommended if Age 76-80years, 30 pack-year currently smoking OR have quit w/in 15years.) does not qualify.   Lung Cancer Screening Referral: n/a  Additional Screening:  Hepatitis C Screening: does qualify; Completed 08/16/2016  Vision Screening: Recommended annual ophthalmology exams for early detection of glaucoma and other disorders of the eye. Is the patient up to date with their annual eye exam?  Yes  Who is the provider or what is the name of the office in which the patient attends annual eye exams? N/A If pt is not established with a provider, would they like to be referred to a provider to establish care? No .   Dental Screening: Recommended annual dental exams for proper oral hygiene  Community  Resource Referral / Chronic Care Management: CRR required this visit?  No   CCM required this visit?  No      Plan:     I have personally reviewed and noted the following in the patient's chart:   Medical and social history Use of alcohol, tobacco or illicit drugs  Current medications and supplements including opioid prescriptions. Patient is not  currently taking opioid prescriptions. Functional ability and status Nutritional status Physical activity Advanced directives List of other physicians Hospitalizations, surgeries, and ER visits in previous 12 months Vitals Screenings to include cognitive, depression, and falls Referrals and appointments  In addition, I have reviewed and discussed with patient certain preventive protocols, quality metrics, and best practice recommendations. A written personalized care plan for preventive services as well as general preventive health recommendations were provided to patient.     Beatrix Fetters, [place]   10/12/2022   Nurse Notes: Non-Face to Face or Face to Face 5 minute visit Encounter     Meghan Vang , Thank you for taking time to come for your Medicare Wellness Visit. I appreciate your ongoing commitment to your health goals. Please review the following plan we discussed and let me know if I can assist you in the future.   These are the goals we discussed:  Goals      Patient Stated     Drink more water and increase activity     Patient Stated     Working on losing weight      Quality of Life Maintained     Evidence-based guidance:  Assess patient's thoughts about quality of life, goals and expectations, and dissatisfaction or desire to improve.  Identify issues of primary importance such as mental health, illness, exercise tolerance, pain, sexual function and intimacy, cognitive change, social isolation, finances and relationships.  Assess and monitor for signs/symptoms of psychosocial concerns, especially depression or ideations regarding harm to others or self; provide or refer for mental health services as needed.  Identify sensory issues that impact quality of life such as hearing loss, vision deficit; strategize ways to maintain or improve hearing, vision.  Promote access to services in the community to support independence such as support groups, home visiting programs,  financial assistance, handicapped parking tags, durable medical equipment and emergency responder.  Promote activities to decrease social isolation such as group support or social, leisure and recreational activities, employment, use of social media; consider safety concerns about being out of home for activities.  Provide patient an opportunity to share by storytelling or a "life review" to give positive meaning to life and to assist with coping and negative experiences.  Encourage patient to tap into hope to improve sense of self.  Counsel based on prognosis and as early as possible about end-of-life and palliative care; consider referral to palliative care provider.  Advocate for the development of palliative care plan that may include avoidance of unnecessary testing and intervention, symptom control, discontinuation of medications, hospice and organ donation.  Counsel as early as possible those with life-limiting chronic disease about palliative care; consider referral to palliative care provider.  Advocate for the development of palliative care plan.   Notes:         This is a list of the screening recommended for you and due dates:  Health Maintenance  Topic Date Due   Mammogram  06/01/2022   COVID-19 Vaccine (5 - 2023-24 season) 10/28/2022*   Flu Shot  02/12/2023*   Zoster (Shingles) Vaccine (1  of 2) 07/26/2024*   Medicare Annual Wellness Visit  10/13/2023   Pneumonia Vaccine  Completed   DEXA scan (bone density measurement)  Completed   Hepatitis C Screening: USPSTF Recommendation to screen - Ages 29-79 yo.  Completed   HPV Vaccine  Aged Out   Cologuard (Stool DNA test)  Discontinued  *Topic was postponed. The date shown is not the original due date.

## 2022-10-12 NOTE — Patient Instructions (Signed)

## 2022-11-10 ENCOUNTER — Encounter: Payer: Self-pay | Admitting: Family Medicine

## 2022-11-10 ENCOUNTER — Ambulatory Visit (INDEPENDENT_AMBULATORY_CARE_PROVIDER_SITE_OTHER): Payer: PPO | Admitting: Family Medicine

## 2022-11-10 VITALS — BP 111/68 | HR 71 | Temp 98.0°F | Ht 60.75 in | Wt 197.0 lb

## 2022-11-10 DIAGNOSIS — M810 Age-related osteoporosis without current pathological fracture: Secondary | ICD-10-CM

## 2022-11-10 DIAGNOSIS — R7309 Other abnormal glucose: Secondary | ICD-10-CM

## 2022-11-10 DIAGNOSIS — Z1231 Encounter for screening mammogram for malignant neoplasm of breast: Secondary | ICD-10-CM | POA: Diagnosis not present

## 2022-11-10 DIAGNOSIS — E559 Vitamin D deficiency, unspecified: Secondary | ICD-10-CM | POA: Diagnosis not present

## 2022-11-10 DIAGNOSIS — E669 Obesity, unspecified: Secondary | ICD-10-CM

## 2022-11-10 DIAGNOSIS — Z0000 Encounter for general adult medical examination without abnormal findings: Secondary | ICD-10-CM

## 2022-11-10 DIAGNOSIS — I10 Essential (primary) hypertension: Secondary | ICD-10-CM | POA: Diagnosis not present

## 2022-11-10 LAB — COMPREHENSIVE METABOLIC PANEL
ALT: 13 U/L (ref 0–35)
AST: 17 U/L (ref 0–37)
Albumin: 4.5 g/dL (ref 3.5–5.2)
Alkaline Phosphatase: 49 U/L (ref 39–117)
BUN: 23 mg/dL (ref 6–23)
CO2: 29 mEq/L (ref 19–32)
Calcium: 10 mg/dL (ref 8.4–10.5)
Chloride: 102 mEq/L (ref 96–112)
Creatinine, Ser: 1.21 mg/dL — ABNORMAL HIGH (ref 0.40–1.20)
GFR: 43.52 mL/min — ABNORMAL LOW (ref >60.00)
Glucose, Bld: 101 mg/dL — ABNORMAL HIGH (ref 70–99)
Potassium: 5.2 mEq/L — ABNORMAL HIGH (ref 3.5–5.1)
Sodium: 141 mEq/L (ref 135–145)
Total Bilirubin: 0.5 mg/dL (ref 0.2–1.2)
Total Protein: 7.5 g/dL (ref 6.0–8.3)

## 2022-11-10 LAB — LIPID PANEL
Cholesterol: 168 mg/dL (ref 0–200)
HDL: 80.7 mg/dL (ref >39.00)
LDL Cholesterol: 64 mg/dL (ref 0–99)
NonHDL: 87.66
Total CHOL/HDL Ratio: 2
Triglycerides: 118 mg/dL (ref 0.0–149.0)
VLDL: 23.6 mg/dL (ref 0.0–40.0)

## 2022-11-10 LAB — CBC
HCT: 40.4 % (ref 36.0–46.0)
Hemoglobin: 13.2 g/dL (ref 12.0–15.0)
MCHC: 32.7 g/dL (ref 30.0–36.0)
MCV: 92.8 fl (ref 78.0–100.0)
Platelets: 300 10*3/uL (ref 150.0–400.0)
RBC: 4.36 Mil/uL (ref 3.87–5.11)
RDW: 13.8 % (ref 11.5–15.5)
WBC: 8.9 10*3/uL (ref 4.0–10.5)

## 2022-11-10 LAB — HEMOGLOBIN A1C: Hgb A1c MFr Bld: 5.9 % (ref 4.6–6.5)

## 2022-11-10 MED ORDER — HYDROCHLOROTHIAZIDE 12.5 MG PO CAPS: 12.5000 mg | ORAL_CAPSULE | Freq: Every day | ORAL | 2 refills | Status: DC

## 2022-11-10 MED ORDER — LISINOPRIL 40 MG PO TABS: ORAL_TABLET | ORAL | 2 refills | Status: DC

## 2022-11-10 MED ORDER — AMLODIPINE BESYLATE 10 MG PO TABS: ORAL_TABLET | ORAL | 2 refills | Status: DC

## 2022-11-10 MED ORDER — SHINGRIX 50 MCG/0.5ML IM SUSR: 0.5000 mL | Freq: Once | INTRAMUSCULAR | 1 refills | Status: AC

## 2022-11-10 NOTE — Patient Instructions (Signed)
No follow-ups on file.        Great to see you today.  I have refilled the medication(s) we provide.   If labs were collected, we will inform you of lab results once received either by echart message or telephone call.   - echart message- for normal results that have been seen by the patient already.   - telephone call: abnormal results or if patient has not viewed results in their echart.  Health Maintenance, Female Adopting a healthy lifestyle and getting preventive care are important in promoting health and wellness. Ask your health care provider about: The right schedule for you to have regular tests and exams. Things you can do on your own to prevent diseases and keep yourself healthy. What should I know about diet, weight, and exercise? Eat a healthy diet  Eat a diet that includes plenty of vegetables, fruits, low-fat dairy products, and lean protein. Do not eat a lot of foods that are high in solid fats, added sugars, or sodium. Maintain a healthy weight Body mass index (BMI) is used to identify weight problems. It estimates body fat based on height and weight. Your health care provider can help determine your BMI and help you achieve or maintain a healthy weight. Get regular exercise Get regular exercise. This is one of the most important things you can do for your health. Most adults should: Exercise for at least 150 minutes each week. The exercise should increase your heart rate and make you sweat (moderate-intensity exercise). Do strengthening exercises at least twice a week. This is in addition to the moderate-intensity exercise. Spend less time sitting. Even light physical activity can be beneficial. Watch cholesterol and blood lipids Have your blood tested for lipids and cholesterol at 76 years of age, then have this test every 5 years. Have your cholesterol levels checked more often if: Your lipid or cholesterol levels are high. You are older than 76 years of  age. You are at high risk for heart disease. What should I know about cancer screening? Depending on your health history and family history, you may need to have cancer screening at various ages. This may include screening for: Breast cancer. Cervical cancer. Colorectal cancer. Skin cancer. Lung cancer. What should I know about heart disease, diabetes, and high blood pressure? Blood pressure and heart disease High blood pressure causes heart disease and increases the risk of stroke. This is more likely to develop in people who have high blood pressure readings or are overweight. Have your blood pressure checked: Every 3-5 years if you are 18-39 years of age. Every year if you are 40 years old or older. Diabetes Have regular diabetes screenings. This checks your fasting blood sugar level. Have the screening done: Once every three years after age 40 if you are at a normal weight and have a low risk for diabetes. More often and at a younger age if you are overweight or have a high risk for diabetes. What should I know about preventing infection? Hepatitis B If you have a higher risk for hepatitis B, you should be screened for this virus. Talk with your health care provider to find out if you are at risk for hepatitis B infection. Hepatitis C Testing is recommended for: Everyone born from 1945 through 1965. Anyone with known risk factors for hepatitis C. Sexually transmitted infections (STIs) Get screened for STIs, including gonorrhea and chlamydia, if: You are sexually active and are younger than 76 years of age. You are   older than 76 years of age and your health care provider tells you that you are at risk for this type of infection. Your sexual activity has changed since you were last screened, and you are at increased risk for chlamydia or gonorrhea. Ask your health care provider if you are at risk. Ask your health care provider about whether you are at high risk for HIV. Your health  care provider may recommend a prescription medicine to help prevent HIV infection. If you choose to take medicine to prevent HIV, you should first get tested for HIV. You should then be tested every 3 months for as long as you are taking the medicine. Pregnancy If you are about to stop having your period (premenopausal) and you may become pregnant, seek counseling before you get pregnant. Take 400 to 800 micrograms (mcg) of folic acid every day if you become pregnant. Ask for birth control (contraception) if you want to prevent pregnancy. Osteoporosis and menopause Osteoporosis is a disease in which the bones lose minerals and strength with aging. This can result in bone fractures. If you are 65 years old or older, or if you are at risk for osteoporosis and fractures, ask your health care provider if you should: Be screened for bone loss. Take a calcium or vitamin D supplement to lower your risk of fractures. Be given hormone replacement therapy (HRT) to treat symptoms of menopause. Follow these instructions at home: Alcohol use Do not drink alcohol if: Your health care provider tells you not to drink. You are pregnant, may be pregnant, or are planning to become pregnant. If you drink alcohol: Limit how much you have to: 0-1 drink a day. Know how much alcohol is in your drink. In the [place] one drink equals one 12 oz bottle of beer (355 mL), one 5 oz glass of wine (148 mL), or one 1 oz glass of hard liquor (44 mL). Lifestyle Do not use any products that contain nicotine or tobacco. These products include cigarettes, chewing tobacco, and vaping devices, such as e-cigarettes. If you need help quitting, ask your health care provider. Do not use street drugs. Do not share needles. Ask your health care provider for help if you need support or information about quitting drugs. General instructions Schedule regular health, dental, and eye exams. Stay current with your vaccines. Tell your health  care provider if: You often feel depressed. You have ever been abused or do not feel safe at home. Summary Adopting a healthy lifestyle and getting preventive care are important in promoting health and wellness. Follow your health care provider's instructions about healthy diet, exercising, and getting tested or screened for diseases. Follow your health care provider's instructions on monitoring your cholesterol and blood pressure. This information is not intended to replace advice given to you by your health care provider. Make sure you discuss any questions you have with your health care provider. Document Revised: 03/22/2021 Document Reviewed: 03/22/2021 Elsevier Patient Education  2023 [employer]  

## 2022-11-10 NOTE — Progress Notes (Signed)
Patient ID: Meghan Vang, female  DOB: [birth year], 76 y.o.   MRN: [id number] Patient Care Team    Relationship Specialty Notifications Start End  Ma Hillock, DO PCP - General Family Medicine  01/19/16     Chief Complaint  Patient presents with   Annual Exam    Pt is fasting    Subjective: Meghan Vang is a 76 y.o.  Female  present for CPE and cmc All past medical history, surgical history, allergies, family history, immunizations, medications and social history were updated in the electronic medical record today. All recent labs, ED visits and hospitalizations within the last year were reviewed.  Health maintenance:  Colonoscopy: Fhx in father. Cologuard neg 01/2020- NL Mammogram: No Fhx. Last mammogram. 06/01/2021 bc-gso> patient desires every 18 months screening >ordered for her today Immunizations: tdap UTD 01/19/2016, influenza vac UTD 08/2022, PNA series completed 2018. Shingrix script provided last year and again today.  Infectious disease screening: hep c completed DEXA: osteoporosis. Vit d def. Dexa 09/2021 -2.5, rpt 2 years Patient has a Dental home. Hospitalizations/ED visits: reviewed  Hypertension/obesity: Pt reports plans with lisinopril 40 and amlodipine 10 . Blood pressures ranges at home not routinely checked. Patient denies chest pain, shortness of breath, dizziness or lower extremity edema.  Pt does not take  daily baby ASA. Pt is not  prescribed statin. Diet: low sodium Exercise: routinely Labs UTD  RF: HTN, Fhx heart disease and stroke, Morbid obesity   elevated a1c: 5.9> 6.0>5.2 last visit. Weight is unchanged.      10/12/2022    1:36 PM 09/22/2021    2:37 PM 09/16/2020    8:27 AM 09/23/2019    8:06 AM 09/20/2018    8:12 AM  Depression screen PHQ 2/9  Decreased Interest 0 0 0 0 0  Down, Depressed, Hopeless 0 0 0 0 0  PHQ - 2 Score 0 0 0 0 0  Altered sleeping     0  Tired, decreased energy     0  Change in appetite     0  Feeling bad or  failure about yourself      0  Trouble concentrating     0  Suicidal thoughts     0  PHQ-9 Score     0  Difficult doing work/chores     Not difficult at all       No data to display          Immunization History  Administered Date(s) Administered   Fluad Quad(high Dose 65+) 08/12/2019, 09/23/2020   Influenza, High Dose Seasonal PF 09/20/2018   Influenza-Unspecified 09/28/2021, 08/14/2022   Moderna Covid-19 Vaccine Bivalent Booster 81yr & up 09/28/2021   Moderna Sars-Covid-2 Vaccination 12/19/2019, 01/17/2020, 09/26/2020   Pneumococcal Conjugate-13 01/19/2016   Pneumococcal Polysaccharide-23 09/19/2017   Tdap 01/19/2016   Past Medical History:  Diagnosis Date   Allergy    Anemia    Chicken pox    COVID    Depression    Hypertension    Menopause    Allergies  Allergen Reactions   Bactrim [Sulfamethoxazole-Trimethoprim]    Past Surgical History:  Procedure Laterality Date   SKIN BIOPSY     TUBAL LIGATION     Family History  Problem Relation Age of Onset   Colon cancer Father 630  Diabetes Father    Heart disease Father    Asthma Mother    Diabetes Mother    Heart disease Mother    Asthma  Brother    Heart disease Brother    Liver cancer Maternal Grandmother    Heart disease Maternal Grandfather    Heart disease Paternal Grandmother    Stroke Paternal Grandfather    Social History   Social History Narrative   Divorced. 2 sons, 1 daughter. [name], [name]).   Some college. Retired. Moved from [place] to be next to family.    Drinks caffeine.    Wears her seatbelt and bike helmet.    Smoke detector in the home.    Feels safe in her relationships.     Allergies as of 11/10/2022       Reactions   Bactrim [sulfamethoxazole-trimethoprim]         Medication List        Accurate as of November 10, 2022  9:20 AM. If you have any questions, ask your nurse or doctor.          amLODipine 10 MG tablet Commonly known as: NORVASC TAKE (1) TABLET  BY MOUTH ONCE DAILY.   ascorbic acid 500 MG tablet Commonly known as: VITAMIN C Take 500 mg by mouth daily.   BIOTIN PO Take by mouth.   Fish Oil 1000 MG Cpdr Take by mouth.   hydrochlorothiazide 12.5 MG capsule Commonly known as: MICROZIDE Take 1 capsule (12.5 mg total) by mouth daily.   lisinopril 40 MG tablet Commonly known as: ZESTRIL TAKE (1) TABLET BY MOUTH ONCE DAILY.   Shingrix injection Generic drug: Zoster Vaccine Adjuvanted Inject 0.5 mLs into the muscle once for 1 dose. Started by: Howard Pouch, DO   Vitamin D (Cholecalciferol) 25 MCG (1000 UT) Caps Take 2 capsules by mouth daily. 2,000 units daily        All past medical history, surgical history, allergies, family history, immunizations andmedications were updated in the EMR today and reviewed under the history and medication portions of their EMR.     No results found for this or any previous visit (from the past 2160 hour(s)).  DG Bone Density  Result Date: 09/17/2021 EXAM: DUAL X-RAY ABSORPTIOMETRY (DXA) FOR BONE MINERAL DENSITY IMPRESSION: Referring Physician:  Elliet Goodnow A Becker Christopher Your patient completed a bone mineral density test using GE Lunar iDXA system (analysis version: 16). Technologist: Domino PATIENT: Name: Meghan Vang, Meghan Vang Patient ID: [id number] Birth Date: Feb 13, 1946 Height: 61.0 in. Sex: Female Measured: 09/17/2021 Weight: 201.0 lbs. Indications: Advanced Age, Caucasian, Estrogen Deficient, Postmenopausal Fractures: None Treatments: Vitamin D (E933.5) ASSESSMENT: The BMD measured at Forearm Radius 33% is 0.666 g/cm2 with a T-score of -2.5. This patient is considered osteoporotic according to [facility]) criteria. The quality of the exam is good. The lumbar spine was excluded due to degenerative changes. Site Region Measured Date Measured Age YA BMD Significant CHANGE T-score DualFemur Neck Left 09/17/2021 75.4 -2.5 0.691 g/cm2 DualFemur Neck Left 10/09/2018 72.4 -2.5 0.688 g/cm2 DualFemur  Total Mean 09/17/2021 75.4 -2.0 0.755 g/cm2 DualFemur Total Mean 10/09/2018 72.4 -2.1 0.739 g/cm2 Left Forearm Radius 33% 09/17/2021 75.4 -2.5 0.666 g/cm2 World Health Organization [facility]) criteria for post-menopausal, Caucasian Women: Normal      ROS 14 pt review of systems performed and negative (unless mentioned in an HPI)  Objective: BP 111/68   Pulse 71   Temp 98 F (36.7 C) (Oral)   Ht 5' 0.75" (1.543 m)   Wt 197 lb (89.4 kg)   SpO2 100%   BMI 37.53 kg/m  Physical Exam Vitals and nursing note reviewed.  Constitutional:      General:  She is not in acute distress.    Appearance: Normal appearance. She is not ill-appearing or toxic-appearing.  HENT:     Head: Normocephalic and atraumatic.     Right Ear: Tympanic membrane, ear canal and external ear normal. There is no impacted cerumen.     Left Ear: Tympanic membrane, ear canal and external ear normal. There is no impacted cerumen.     Nose: No congestion or rhinorrhea.     Mouth/Throat:     Mouth: Mucous membranes are moist.     Pharynx: Oropharynx is clear. No oropharyngeal exudate or posterior oropharyngeal erythema.  Eyes:     General: No scleral icterus.       Right eye: No discharge.        Left eye: No discharge.     Extraocular Movements: Extraocular movements intact.     Conjunctiva/sclera: Conjunctivae normal.     Pupils: Pupils are equal, round, and reactive to light.  Cardiovascular:     Rate and Rhythm: Normal rate and regular rhythm.     Pulses: Normal pulses.     Heart sounds: Normal heart sounds. No murmur heard.    No friction rub. No gallop.  Pulmonary:     Effort: Pulmonary effort is normal. No respiratory distress.     Breath sounds: Normal breath sounds. No stridor. No wheezing, rhonchi or rales.  Chest:     Chest wall: No tenderness.  Abdominal:     General: Abdomen is flat. Bowel sounds are normal. There is no distension.     Palpations: Abdomen is soft. There is no mass.     Tenderness: There  is no abdominal tenderness. There is no right CVA tenderness, left CVA tenderness, guarding or rebound.     Hernia: No hernia is present.  Musculoskeletal:        General: No swelling, tenderness or deformity. Normal range of motion.     Cervical back: Normal range of motion and neck supple. No rigidity or tenderness.     Right lower leg: No edema.     Left lower leg: No edema.  Lymphadenopathy:     Cervical: No cervical adenopathy.  Skin:    General: Skin is warm and dry.     Coloration: Skin is not jaundiced or pale.     Findings: No bruising, erythema, lesion or rash.  Neurological:     General: No focal deficit present.     Mental Status: She is alert and oriented to person, place, and time. Mental status is at baseline.     Cranial Nerves: No cranial nerve deficit.     Sensory: No sensory deficit.     Motor: No weakness.     Coordination: Coordination normal.     Gait: Gait normal.     Deep Tendon Reflexes: Reflexes normal.  Psychiatric:        Mood and Affect: Mood normal.        Behavior: Behavior normal.        Thought Content: Thought content normal.        Judgment: Judgment normal.      No results found.  Assessment/plan: Kurt Azimi is a 76 y.o. female present for CPE and Chronic Conditions/illness Management Essential hypertension, benign/ Obesity (BMI 30-39.9) Stable Continue lisinopril (ZESTRIL) 40 MG tablet Continue amLODipine (NORVASC) 10 MG tablet Continue HCTZ CBC, CMP, lipid and TSH collected today - 5.5 mos follow up (CPE)   Osteoporosis without current pathological fracture, unspecified osteoporosis type/Vitamin D deficiency --  dexa UTD 2022 - continue vit d supplement.  Vitamin D collected today   Prediabetes: - a1c-  5.2.> a1c collected today -A1c collected today  Routine general medical examination at a health care facility Patient was encouraged to exercise greater than 150 minutes a week. Patient was encouraged to choose a diet filled  with fresh fruits and vegetables, and lean meats. AVS provided to patient today for education/recommendation on gender specific health and safety maintenance. Colonoscopy: Fhx in father. Cologuard neg 01/2020- NL Mammogram: No Fhx. Last mammogram. 06/01/2021 bc-gso> patient desires every 18 months screening >ordered for her today Immunizations: tdap UTD 01/19/2016, influenza vac UTD 08/2022, PNA series completed 2018. Shingrix script provided last year and again today.  Infectious disease screening: hep c completed DEXA: osteoporosis. Vit d def. Dexa 09/2021 -2.5, rpt 2 years  Return in about 24 weeks (around 04/27/2023) for Routine chronic condition follow-up.  Orders Placed This Encounter  Procedures   MM 3D SCREEN BREAST BILATERAL   VITAMIN D 25 Hydroxy (Vit-D Deficiency, Fractures)   CBC   Comprehensive metabolic panel   Hemoglobin A1c   Lipid panel   TSH   Meds ordered this encounter  Medications   Zoster Vaccine Adjuvanted [facility]) injection    Sig: Inject 0.5 mLs into the muscle once for 1 dose.    Dispense:  1 each    Refill:  1   DISCONTD: amLODipine (NORVASC) 10 MG tablet    Sig: TAKE (1) TABLET BY MOUTH ONCE DAILY.    Dispense:  90 tablet    Refill:  2   DISCONTD: hydrochlorothiazide (MICROZIDE) 12.5 MG capsule    Sig: Take 1 capsule (12.5 mg total) by mouth daily.    Dispense:  90 capsule    Refill:  2   DISCONTD: lisinopril (ZESTRIL) 40 MG tablet    Sig: TAKE (1) TABLET BY MOUTH ONCE DAILY.    Dispense:  90 tablet    Refill:  2   DISCONTD: lisinopril (ZESTRIL) 40 MG tablet    Sig: TAKE (1) TABLET BY MOUTH ONCE DAILY.    Dispense:  90 tablet    Refill:  2   DISCONTD: hydrochlorothiazide (MICROZIDE) 12.5 MG capsule    Sig: Take 1 capsule (12.5 mg total) by mouth daily.    Dispense:  90 capsule    Refill:  2   DISCONTD: amLODipine (NORVASC) 10 MG tablet    Sig: TAKE (1) TABLET BY MOUTH ONCE DAILY.    Dispense:  90 tablet    Refill:  2   DISCONTD:  hydrochlorothiazide (MICROZIDE) 12.5 MG capsule    Sig: Take 1 capsule (12.5 mg total) by mouth daily.    Dispense:  90 capsule    Refill:  2   DISCONTD: amLODipine (NORVASC) 10 MG tablet    Sig: TAKE (1) TABLET BY MOUTH ONCE DAILY.    Dispense:  90 tablet    Refill:  2   DISCONTD: lisinopril (ZESTRIL) 40 MG tablet    Sig: TAKE (1) TABLET BY MOUTH ONCE DAILY.    Dispense:  90 tablet    Refill:  2   DISCONTD: amLODipine (NORVASC) 10 MG tablet    Sig: TAKE (1) TABLET BY MOUTH ONCE DAILY.    Dispense:  90 tablet    Refill:  2   DISCONTD: hydrochlorothiazide (MICROZIDE) 12.5 MG capsule    Sig: Take 1 capsule (12.5 mg total) by mouth daily.    Dispense:  90 capsule    Refill:  2  DISCONTD: lisinopril (ZESTRIL) 40 MG tablet    Sig: TAKE (1) TABLET BY MOUTH ONCE DAILY.    Dispense:  90 tablet    Refill:  2   amLODipine (NORVASC) 10 MG tablet    Sig: TAKE (1) TABLET BY MOUTH ONCE DAILY.    Dispense:  90 tablet    Refill:  2   hydrochlorothiazide (MICROZIDE) 12.5 MG capsule    Sig: Take 1 capsule (12.5 mg total) by mouth daily.    Dispense:  90 capsule    Refill:  2   lisinopril (ZESTRIL) 40 MG tablet    Sig: TAKE (1) TABLET BY MOUTH ONCE DAILY.    Dispense:  90 tablet    Refill:  2   Referral Orders  No referral(s) requested today     Electronically signed by: Howard Pouch, [place]

## 2022-11-11 ENCOUNTER — Telehealth: Payer: Self-pay | Admitting: Family Medicine

## 2022-11-11 DIAGNOSIS — E875 Hyperkalemia: Secondary | ICD-10-CM

## 2022-11-11 DIAGNOSIS — R7989 Other specified abnormal findings of blood chemistry: Secondary | ICD-10-CM

## 2022-11-11 LAB — VITAMIN D 25 HYDROXY (VIT D DEFICIENCY, FRACTURES): VITD: 30.92 ng/mL (ref 30.00–100.00)

## 2022-11-11 LAB — TSH: TSH: 2.05 u[IU]/mL (ref 0.35–5.50)

## 2022-11-11 NOTE — Telephone Encounter (Signed)
Please call patient Liver and thyroid function are normal Blood cell counts  normal Diabetes screening/A1c is at the high end of normal to early prediabetes range.  Increasing exercise will help lower her A1c back to her normal.  It is just barely elevated from prior 5.6, now 5.9. Cholesterol panel is great and at goal with an LDL/bad cholesterol of 64.  Vitamin D is normal at 30.92 but on the low end of normal.  Would encourage her to increase her vitamin D supplement from 2000 units daily to 3000 units daily.   Her kidney function has changed from last year and her potassium is just mildly elevated at 5.2 (normal 5.1 and lower).  I would like her to work on hydrating over the next week.  Make sure to take in at least 60-80 ounces of water daily.  And come in for a lab appointment only in 1-2 weeks to have kidney function and potassium retested.  If still abnormal at that time, then we will need to follow-up and discuss potential causes and workup.  This is not a fasting lab.

## 2022-11-15 NOTE — Telephone Encounter (Signed)
Spoke with pt regarding labs and instructions.   

## 2022-11-25 ENCOUNTER — Other Ambulatory Visit: Payer: PPO

## 2022-11-30 ENCOUNTER — Other Ambulatory Visit: Payer: PPO

## 2022-12-01 ENCOUNTER — Other Ambulatory Visit (INDEPENDENT_AMBULATORY_CARE_PROVIDER_SITE_OTHER): Payer: PPO

## 2022-12-01 DIAGNOSIS — E875 Hyperkalemia: Secondary | ICD-10-CM | POA: Diagnosis not present

## 2022-12-01 DIAGNOSIS — R7989 Other specified abnormal findings of blood chemistry: Secondary | ICD-10-CM | POA: Diagnosis not present

## 2022-12-02 ENCOUNTER — Telehealth: Payer: Self-pay | Admitting: Family Medicine

## 2022-12-02 LAB — BASIC METABOLIC PANEL
BUN/Creatinine Ratio: 21 (calc) (ref 6–22)
BUN: 26 mg/dL — ABNORMAL HIGH (ref 7–25)
CO2: 25 mmol/L (ref 20–32)
Calcium: 9.6 mg/dL (ref 8.6–10.4)
Chloride: 101 mmol/L (ref 98–110)
Creat: 1.21 mg/dL — ABNORMAL HIGH (ref 0.60–1.00)
Glucose, Bld: 95 mg/dL (ref 65–99)
Potassium: 4.3 mmol/L (ref 3.5–5.3)
Sodium: 139 mmol/L (ref 135–146)

## 2022-12-02 NOTE — Telephone Encounter (Signed)
Spoke with patient regarding results/recommendations.  

## 2022-12-02 NOTE — Telephone Encounter (Signed)
Please call patient: Her kidney function has remained slightly decreased, but stable from her prior collection. We will continue to monitor at her next appointment.  In the meantime I recommend she focus on hydration and ensure her blood pressure is well-controlled.  This will help preserve remaining kidney function.  Again the kidney function is just mildly decreased.

## 2023-02-09 ENCOUNTER — Other Ambulatory Visit: Payer: Self-pay | Admitting: Family Medicine

## 2023-02-09 DIAGNOSIS — Z1211 Encounter for screening for malignant neoplasm of colon: Secondary | ICD-10-CM

## 2023-02-13 ENCOUNTER — Telehealth: Payer: Self-pay

## 2023-02-13 ENCOUNTER — Ambulatory Visit: Payer: PPO

## 2023-02-13 NOTE — Addendum Note (Signed)
Addended by: Beatrix Fetters on: 02/13/2023 03:35 PM   Modules accepted: Orders

## 2023-02-13 NOTE — Telephone Encounter (Signed)
Spoke with patient regarding results/recommendations.  

## 2023-02-13 NOTE — Telephone Encounter (Signed)
Patient was seen back in December for CPE by Dr. Raoul Pitch. At that time she thought Dr. Raoul Pitch told her she would not have to do colonoscopy/cologuard tests.  She received a text message saying that cologuard test was in the mail to her.  Please advise.  Patient aware Dr. Raoul Pitch out of office today.  Please call tomorrow.

## 2023-03-28 ENCOUNTER — Ambulatory Visit: Payer: PPO

## 2023-04-12 ENCOUNTER — Ambulatory Visit
Admission: RE | Admit: 2023-04-12 | Discharge: 2023-04-12 | Disposition: A | Discharge status: Home / Self Care | Payer: PPO | Source: Ambulatory Visit | Attending: Family Medicine | Admitting: Family Medicine

## 2023-04-12 DIAGNOSIS — Z1231 Encounter for screening mammogram for malignant neoplasm of breast: Secondary | ICD-10-CM

## 2023-04-27 ENCOUNTER — Encounter: Payer: Self-pay | Admitting: Family Medicine

## 2023-04-27 ENCOUNTER — Ambulatory Visit (INDEPENDENT_AMBULATORY_CARE_PROVIDER_SITE_OTHER): Payer: PPO | Admitting: Family Medicine

## 2023-04-27 VITALS — BP 138/75 | HR 72 | Temp 97.9°F | Wt 201.8 lb

## 2023-04-27 DIAGNOSIS — E559 Vitamin D deficiency, unspecified: Secondary | ICD-10-CM

## 2023-04-27 DIAGNOSIS — I10 Essential (primary) hypertension: Secondary | ICD-10-CM | POA: Diagnosis not present

## 2023-04-27 DIAGNOSIS — R7989 Other specified abnormal findings of blood chemistry: Secondary | ICD-10-CM

## 2023-04-27 DIAGNOSIS — E669 Obesity, unspecified: Secondary | ICD-10-CM | POA: Diagnosis not present

## 2023-04-27 DIAGNOSIS — R7309 Other abnormal glucose: Secondary | ICD-10-CM

## 2023-04-27 DIAGNOSIS — E875 Hyperkalemia: Secondary | ICD-10-CM

## 2023-04-27 DIAGNOSIS — M810 Age-related osteoporosis without current pathological fracture: Secondary | ICD-10-CM

## 2023-04-27 LAB — COMPREHENSIVE METABOLIC PANEL
ALT: 12 U/L (ref 0–35)
AST: 16 U/L (ref 0–37)
Albumin: 4.4 g/dL (ref 3.5–5.2)
Alkaline Phosphatase: 57 U/L (ref 39–117)
BUN: 22 mg/dL (ref 6–23)
CO2: 31 mEq/L (ref 19–32)
Calcium: 10.3 mg/dL (ref 8.4–10.5)
Chloride: 100 mEq/L (ref 96–112)
Creatinine, Ser: 0.97 mg/dL (ref 0.40–1.20)
GFR: 56.56 mL/min — ABNORMAL LOW (ref >60.00)
Glucose, Bld: 100 mg/dL — ABNORMAL HIGH (ref 70–99)
Potassium: 4.5 mEq/L (ref 3.5–5.1)
Sodium: 139 mEq/L (ref 135–145)
Total Bilirubin: 0.4 mg/dL (ref 0.2–1.2)
Total Protein: 7.5 g/dL (ref 6.0–8.3)

## 2023-04-27 LAB — HEMOGLOBIN A1C: Hgb A1c MFr Bld: 5.9 % (ref 4.6–6.5)

## 2023-04-27 MED ORDER — LISINOPRIL 40 MG PO TABS
ORAL_TABLET | ORAL | 2 refills | Status: DC
Start: 2023-04-27 — End: 2023-11-28

## 2023-04-27 MED ORDER — METOPROLOL SUCCINATE ER 25 MG PO TB24
25.0000 mg | ORAL_TABLET | Freq: Every day | ORAL | 1 refills | Status: DC
Start: 2023-04-27 — End: 2023-10-18

## 2023-04-27 MED ORDER — AMLODIPINE BESYLATE 10 MG PO TABS: ORAL_TABLET | ORAL | 2 refills | Status: DC

## 2023-04-27 NOTE — Progress Notes (Signed)
Patient ID: Meghan Vang, female  DOB: May 06, 1946, 77 y.o.   MRN: [id number] Patient Care Team    Relationship Specialty Notifications Start End  Natalia Leatherwood, DO PCP - General Family Medicine  01/19/16     Chief Complaint  Patient presents with   Hypertension    Subjective: Meghan Vang is a 77 y.o.  Female  present for Chronic Conditions/illness Management All past medical history, surgical history, allergies, family history, immunizations, medications and social history were updated in the electronic medical record today. All recent labs, ED visits and hospitalizations within the last year were reviewed.  Hypertension/obesity: Pt reports compliance with lisinopril 40 and amlodipine 10 . Blood pressures ranges at home not routinely checked Patient denies chest pain, shortness of breath, dizziness or lower extremity edema.  Pt does not take  daily baby ASA. Pt is not  prescribed statin. Diet: low sodium Exercise: routinely Labs UTD  RF: HTN, Fhx heart disease and stroke, Morbid obesity   elevated a1c: 5.9> 6.0>5.2> 5.6>5.9 last visit.      04/27/2023    8:40 AM 10/12/2022    1:36 PM 09/22/2021    2:37 PM 09/16/2020    8:27 AM 09/23/2019    8:06 AM  Depression screen PHQ 2/9  Decreased Interest 0 0 0 0 0  Down, Depressed, Hopeless 0 0 0 0 0  PHQ - 2 Score 0 0 0 0 0       No data to display          Immunization History  Administered Date(s) Administered   Fluad Quad(high Dose 65+) 08/12/2019, 09/23/2020   Influenza, High Dose Seasonal PF 09/20/2018   Influenza-Unspecified 09/28/2021, 08/14/2022   Moderna Covid-19 Vaccine Bivalent Booster 106yrs & up 09/28/2021   Moderna Sars-Covid-2 Vaccination 12/19/2019, 01/17/2020, 09/26/2020   Pneumococcal Conjugate-13 01/19/2016   Pneumococcal Polysaccharide-23 09/19/2017   Tdap 01/19/2016   Past Medical History:  Diagnosis Date   Allergy    Anemia    Chicken pox    COVID    Depression    Hypertension     Menopause    Allergies  Allergen Reactions   Bactrim [Sulfamethoxazole-Trimethoprim]    Past Surgical History:  Procedure Laterality Date   SKIN BIOPSY     TUBAL LIGATION     Family History  Problem Relation Age of Onset   Colon cancer Father 106   Diabetes Father    Heart disease Father    Asthma Mother    Diabetes Mother    Heart disease Mother    Asthma Brother    Heart disease Brother    Liver cancer Maternal Grandmother    Heart disease Maternal Grandfather    Heart disease Paternal Grandmother    Stroke Paternal Grandfather    Social History   Social History Narrative   Divorced. 2 sons, 1 daughter. Meghan Vang, [place]).   Some college. Retired. Moved from [place] to be next to family.    Drinks caffeine.    Wears her seatbelt and bike helmet.    Smoke detector in the home.    Feels safe in her relationships.     Allergies as of 04/27/2023       Reactions   Bactrim [sulfamethoxazole-trimethoprim]         Medication List        Accurate as of April 27, 2023  9:10 AM. If you have any questions, ask your nurse or doctor.  STOP taking these medications    hydrochlorothiazide 12.5 MG capsule Commonly known as: MICROZIDE Stopped by: Felix Pacini, DO       TAKE these medications    amLODipine 10 MG tablet Commonly known as: NORVASC TAKE (1) TABLET BY MOUTH ONCE DAILY.   ascorbic acid 500 MG tablet Commonly known as: VITAMIN C Take 500 mg by mouth daily.   BIOTIN PO Take by mouth.   Fish Oil 1000 MG Cpdr Take by mouth.   lisinopril 40 MG tablet Commonly known as: ZESTRIL TAKE (1) TABLET BY MOUTH ONCE DAILY.   metoprolol succinate 25 MG 24 hr tablet Commonly known as: TOPROL-XL Take 1 tablet (25 mg total) by mouth daily. Started by: Felix Pacini, DO   Vitamin D (Cholecalciferol) 25 MCG (1000 UT) Caps Take 2 capsules by mouth daily. 2,000 units daily        All past medical history, surgical history, allergies, family  history, immunizations andmedications were updated in the EMR today and reviewed under the history and medication portions of their EMR.     No results found for this or any previous visit (from the past 2160 hour(s)).   ROS 14 pt review of systems performed and negative (unless mentioned in an HPI)  Objective: BP 138/75   Pulse 72   Temp 97.9 F (36.6 C)   Wt 201 lb 12.8 oz (91.5 kg)   SpO2 99%   BMI 38.44 kg/m  Physical Exam Vitals and nursing note reviewed.  Constitutional:      General: She is not in acute distress.    Appearance: Normal appearance. She is not ill-appearing, toxic-appearing or diaphoretic.  HENT:     Head: Normocephalic and atraumatic.  Eyes:     General: No scleral icterus.       Right eye: No discharge.        Left eye: No discharge.     Extraocular Movements: Extraocular movements intact.     Conjunctiva/sclera: Conjunctivae normal.     Pupils: Pupils are equal, round, and reactive to light.  Cardiovascular:     Rate and Rhythm: Normal rate and regular rhythm.     Heart sounds: No murmur heard. Pulmonary:     Effort: Pulmonary effort is normal. No respiratory distress.     Breath sounds: Normal breath sounds. No wheezing, rhonchi or rales.  Musculoskeletal:     Right lower leg: No edema.     Left lower leg: No edema.  Skin:    General: Skin is warm.     Findings: No rash.  Neurological:     Mental Status: She is alert and oriented to person, place, and time. Mental status is at baseline.     Motor: No weakness.     Gait: Gait normal.  Psychiatric:        Mood and Affect: Mood normal.        Behavior: Behavior normal.        Thought Content: Thought content normal.        Judgment: Judgment normal.      No results found.  Assessment/plan: Marshea Ragen is a 77 y.o. female present for Chronic Conditions/illness Management Essential hypertension, benign/ Obesity (BMI 30-39.9) Stable Continue  lisinopril (ZESTRIL) 40 MG  tablet Continue amLODipine (NORVASC) 10 MG tablet DC HCTZ (dehydration.   CMP collected today for decrease kidney fx last visit - 5.5 mos follow up (CPE)   Osteoporosis without current pathological fracture, unspecified osteoporosis type/Vitamin D deficiency -- dexa UTD  2022 - continue vit d supplement.  Vitamin D UTD 2023   Prediabetes: - a1c-  5.2.> 5.9> 6.0>5.2> 5.6>5.9 > a1c collected today   Return in about 7 months (around 11/20/2023) for cpe (20 min), Routine chronic condition follow-up.  Orders Placed This Encounter  Procedures   Comp Met (CMET)   Hemoglobin A1c   Meds ordered this encounter  Medications   amLODipine (NORVASC) 10 MG tablet    Sig: TAKE (1) TABLET BY MOUTH ONCE DAILY.    Dispense:  90 tablet    Refill:  2   lisinopril (ZESTRIL) 40 MG tablet    Sig: TAKE (1) TABLET BY MOUTH ONCE DAILY.    Dispense:  90 tablet    Refill:  2   metoprolol succinate (TOPROL-XL) 25 MG 24 hr tablet    Sig: Take 1 tablet (25 mg total) by mouth daily.    Dispense:  90 tablet    Refill:  1    DC hctz please   Referral Orders  No referral(s) requested today     Electronically signed by: Felix Pacini, DO [facility] [place]

## 2023-04-27 NOTE — Patient Instructions (Addendum)
Return in about 7 months (around 11/20/2023) for cpe (20 min), Routine chronic condition follow-up.  Low Glycemic index diet is best.      Great to see you today.  I have refilled the medication(s) we provide.   If labs were collected, we will inform you of lab results once received either by echart message or telephone call.   - echart message- for normal results that have been seen by the patient already.   - telephone call: abnormal results or if patient has not viewed results in their echart.

## 2023-10-16 ENCOUNTER — Other Ambulatory Visit: Payer: Self-pay | Admitting: Family Medicine

## 2023-10-18 ENCOUNTER — Telehealth: Payer: Self-pay | Admitting: Family Medicine

## 2023-10-18 ENCOUNTER — Other Ambulatory Visit: Payer: Self-pay | Admitting: Family Medicine

## 2023-10-18 MED ORDER — METOPROLOL SUCCINATE ER 25 MG PO TB24: 25.0000 mg | ORAL_TABLET | Freq: Every day | ORAL | 0 refills | Status: DC

## 2023-10-18 NOTE — Telephone Encounter (Signed)
Rx sent 

## 2023-10-18 NOTE — Telephone Encounter (Signed)
Prescription Request  10/18/2023  LOV: 04/27/2023  *Patient will be out of medication by Sunday. She needs a new prescription for Metoprolol  [facility] is the preferred pharmacy   What is the name of the medication or equipment? metoprolol succinate (TOPROL-XL) 25 MG 24 hr tablet   Have you contacted your pharmacy to request a refill? Yes   Which pharmacy would you like this sent to?  [facility] - [place], [place] - [address] [place] [address] Phone: [phone] Fax: [phone]  [facility] [phone] - [place], [place] - [address] AT [facility] [address] [place] [address] Phone: [phone] Fax: [phone]    Patient notified that their request is being sent to the clinical staff for review and that they should receive a response within 2 business days.   Please advise at [facility] [phone]

## 2023-11-20 ENCOUNTER — Other Ambulatory Visit: Payer: Self-pay | Admitting: Family Medicine

## 2023-11-21 ENCOUNTER — Other Ambulatory Visit: Payer: Self-pay | Admitting: Family Medicine

## 2023-11-21 ENCOUNTER — Telehealth: Payer: Self-pay

## 2023-11-21 NOTE — Telephone Encounter (Signed)
 Attempted to contact pt could not LVM

## 2023-11-21 NOTE — Telephone Encounter (Signed)
 Copied from CRM [id number]. Topic: Clinical - Medication Refill >> Nov 21, 2023 10:55 AM Powell NOVAK wrote: Most Recent Primary Care Visit:  Provider: CATHERINE FULLER A  Department: [facility]  Visit Type: OFFICE VISIT  Date: 04/27/2023  Medication: metoprolol  succinate (TOPROL -XL) 25 MG 24 hr tablet  Has the patient contacted their pharmacy? Yes-no refills (Agent: If no, request that the patient contact the pharmacy for the refill. If patient does not wish to contact the pharmacy document the reason why and proceed with request.) (Agent: If yes, when and what did the pharmacy advise?)  Is this the correct pharmacy for this prescription? yes If no, delete pharmacy and type the correct one.  This is the patient's preferred pharmacy:  [facility] - [place], [place] - [address] [place] [address] Phone: [phone] Fax: [phone]     Has the prescription been filled recently? yes  Is the patient out of the medication? yes  Has the patient been seen for an appointment in the last year OR does the patient have an upcoming appointment? yes  Can we respond through MyChart? yes  Agent: Please be advised that Rx refills may take up to 3 business days. We ask that you follow-up with your pharmacy.

## 2023-11-27 ENCOUNTER — Ambulatory Visit (INDEPENDENT_AMBULATORY_CARE_PROVIDER_SITE_OTHER): Payer: PPO | Admitting: Family Medicine

## 2023-11-27 ENCOUNTER — Other Ambulatory Visit: Payer: Self-pay

## 2023-11-27 DIAGNOSIS — Z0000 Encounter for general adult medical examination without abnormal findings: Secondary | ICD-10-CM

## 2023-11-27 NOTE — Progress Notes (Signed)
   Same-day cancel Monday morning CPE

## 2023-11-27 NOTE — Patient Instructions (Signed)

## 2023-11-28 ENCOUNTER — Other Ambulatory Visit: Payer: Self-pay | Admitting: Family Medicine

## 2023-11-28 ENCOUNTER — Telehealth: Payer: Self-pay

## 2023-11-28 ENCOUNTER — Other Ambulatory Visit: Payer: Self-pay

## 2023-11-28 MED ORDER — AMLODIPINE BESYLATE 10 MG PO TABS: ORAL_TABLET | ORAL | 0 refills | Status: DC

## 2023-11-28 MED ORDER — LISINOPRIL 40 MG PO TABS: ORAL_TABLET | ORAL | 0 refills | Status: DC

## 2023-11-28 NOTE — Telephone Encounter (Signed)
 Copied from CRM [id number]. Topic: Clinical - Medical Advice >> Nov 27, 2023  8:18 AM Russell PARAS wrote: Reason for CRM: Patient is requesting if possible, a portion of her physical can be conducted by phone. She had to cancel 01/13 appt due to road conditions and could not be rescheduled until 02/20. She notes she can come in separate day for labwork. She is currently on waitlist. CB# [phone]

## 2023-11-28 NOTE — Telephone Encounter (Signed)
 Meds sent to pharmacy for 30 d/s to last til appt

## 2023-12-25 ENCOUNTER — Other Ambulatory Visit: Payer: Self-pay | Admitting: Family Medicine

## 2023-12-25 NOTE — Telephone Encounter (Signed)
 Last Fill: 11/28/23  Last OV: 04/27/23 Next OV: 01/04/24  Routing to provider for review/authorization.

## 2023-12-25 NOTE — Telephone Encounter (Signed)
 Copied from CRM [id number]. Topic: Clinical - Medication Refill >> Dec 25, 2023  9:04 AM Meghan Vang wrote: Most Recent Primary Care Visit:  Provider: Napolean Backbone A  Department: [facility]  Visit Type: OFFICE VISIT  Date: 04/27/2023  Medication: metoprolol  succinate (TOPROL -XL) 25 MG 24 hr tablet  Has the patient contacted their pharmacy? Yes (Agent: If no, request that the patient contact the pharmacy for the refill. If patient does not wish to contact the pharmacy document the reason why and proceed with request.) (Agent: If yes, when and what did the pharmacy advise?)  Is this the correct pharmacy for this prescription? Yes If no, delete pharmacy and type the correct one.  This is the patient's preferred pharmacy:  [facility] - [place], [place] - [address] [place] [address] Phone: [phone] Fax: [phone]   Has the prescription been filled recently? No  Is the patient out of the medication? No  Has the patient been seen for an appointment in the last year OR does the patient have an upcoming appointment? Yes - patient is going to run out a few days before her next appointment  Can we respond through MyChart? Yes  Agent: Please be advised that Rx refills may take up to 3 business days. We ask that you follow-up with your pharmacy.

## 2023-12-28 ENCOUNTER — Telehealth: Payer: Self-pay

## 2023-12-28 ENCOUNTER — Other Ambulatory Visit: Payer: Self-pay

## 2023-12-28 MED ORDER — METOPROLOL SUCCINATE ER 25 MG PO TB24: 25.0000 mg | ORAL_TABLET | Freq: Every day | ORAL | 0 refills | Status: DC

## 2023-12-28 NOTE — Telephone Encounter (Signed)
Copied from CRM [id number]. Topic: Clinical - Prescription Issue >> Dec 28, 2023  1:10 PM Jon Gills C wrote: Reason for CRM: Patient called in regarding medication metoprolol succinate (TOPROL-XL) 25 MG 24 hr tablet stated it has still not been called in to the pharmacy, would like a to the status of the refill for a callback is asking to be called back about this matter

## 2023-12-28 NOTE — Telephone Encounter (Signed)
Rx sent. Pt made aware.

## 2024-01-04 ENCOUNTER — Encounter: Payer: PPO | Admitting: Family Medicine

## 2024-01-11 ENCOUNTER — Ambulatory Visit (INDEPENDENT_AMBULATORY_CARE_PROVIDER_SITE_OTHER): Payer: PPO | Admitting: Family Medicine

## 2024-01-11 ENCOUNTER — Encounter: Payer: Self-pay | Admitting: Family Medicine

## 2024-01-11 VITALS — BP 130/76 | HR 78 | Temp 98.3°F | Ht 61.0 in | Wt 208.6 lb

## 2024-01-11 DIAGNOSIS — E559 Vitamin D deficiency, unspecified: Secondary | ICD-10-CM | POA: Diagnosis not present

## 2024-01-11 DIAGNOSIS — M810 Age-related osteoporosis without current pathological fracture: Secondary | ICD-10-CM | POA: Diagnosis not present

## 2024-01-11 DIAGNOSIS — Z23 Encounter for immunization: Secondary | ICD-10-CM | POA: Diagnosis not present

## 2024-01-11 DIAGNOSIS — I10 Essential (primary) hypertension: Secondary | ICD-10-CM

## 2024-01-11 DIAGNOSIS — Z1231 Encounter for screening mammogram for malignant neoplasm of breast: Secondary | ICD-10-CM

## 2024-01-11 DIAGNOSIS — Z0000 Encounter for general adult medical examination without abnormal findings: Secondary | ICD-10-CM | POA: Diagnosis not present

## 2024-01-11 DIAGNOSIS — E669 Obesity, unspecified: Secondary | ICD-10-CM | POA: Diagnosis not present

## 2024-01-11 DIAGNOSIS — R7309 Other abnormal glucose: Secondary | ICD-10-CM

## 2024-01-11 LAB — HEMOGLOBIN A1C: Hgb A1c MFr Bld: 6 % (ref 4.6–6.5)

## 2024-01-11 LAB — COMPREHENSIVE METABOLIC PANEL
ALT: 11 U/L (ref 0–35)
AST: 16 U/L (ref 0–37)
Albumin: 4.3 g/dL (ref 3.5–5.2)
Alkaline Phosphatase: 58 U/L (ref 39–117)
BUN: 19 mg/dL (ref 6–23)
CO2: 29 meq/L (ref 19–32)
Calcium: 9.9 mg/dL (ref 8.4–10.5)
Chloride: 103 meq/L (ref 96–112)
Creatinine, Ser: 0.96 mg/dL (ref 0.40–1.20)
GFR: 56.98 mL/min — ABNORMAL LOW (ref >60.00)
Glucose, Bld: 108 mg/dL — ABNORMAL HIGH (ref 70–99)
Potassium: 4.3 meq/L (ref 3.5–5.1)
Sodium: 141 meq/L (ref 135–145)
Total Bilirubin: 0.4 mg/dL (ref 0.2–1.2)
Total Protein: 7.4 g/dL (ref 6.0–8.3)

## 2024-01-11 LAB — CBC
HCT: 41.6 % (ref 36.0–46.0)
Hemoglobin: 13.7 g/dL (ref 12.0–15.0)
MCHC: 32.8 g/dL (ref 30.0–36.0)
MCV: 92.5 fl (ref 78.0–100.0)
Platelets: 283 10*3/uL (ref 150.0–400.0)
RBC: 4.5 Mil/uL (ref 3.87–5.11)
RDW: 13.9 % (ref 11.5–15.5)
WBC: 8.3 10*3/uL (ref 4.0–10.5)

## 2024-01-11 LAB — TSH: TSH: 1.5 u[IU]/mL (ref 0.35–5.50)

## 2024-01-11 LAB — LIPID PANEL
Cholesterol: 154 mg/dL (ref 0–200)
HDL: 68.9 mg/dL (ref >39.00)
LDL Cholesterol: 65 mg/dL (ref 0–99)
NonHDL: 85.58
Total CHOL/HDL Ratio: 2
Triglycerides: 101 mg/dL (ref 0.0–149.0)
VLDL: 20.2 mg/dL (ref 0.0–40.0)

## 2024-01-11 LAB — VITAMIN D 25 HYDROXY (VIT D DEFICIENCY, FRACTURES): VITD: 32.65 ng/mL (ref 30.00–100.00)

## 2024-01-11 MED ORDER — AMLODIPINE BESYLATE 10 MG PO TABS: ORAL_TABLET | ORAL | 0 refills | Status: DC

## 2024-01-11 MED ORDER — FUROSEMIDE 20 MG PO TABS: 10.0000 mg | ORAL_TABLET | Freq: Every day | ORAL | 3 refills | Status: DC | PRN

## 2024-01-11 MED ORDER — LISINOPRIL 40 MG PO TABS: ORAL_TABLET | ORAL | 0 refills | Status: DC

## 2024-01-11 MED ORDER — METOPROLOL SUCCINATE ER 25 MG PO TB24
25.0000 mg | ORAL_TABLET | Freq: Every day | ORAL | 0 refills | Status: DC
Start: 2024-01-11 — End: 2024-02-21

## 2024-01-11 NOTE — Progress Notes (Signed)
 Patient ID: Meghan Vang, female  DOB: [birth year], 78 y.o.   MRN: [id number] Patient Care Team    Relationship Specialty Notifications Start End  Natalia Leatherwood, DO PCP - General Family Medicine  01/19/16     Chief Complaint  Patient presents with   Annual Exam    Chronic Conditions/illness Management Pt is fasting.     Subjective: Meghan Vang is a 78 y.o.  Female  present for CPE and chronic condition management appointment All past medical history, surgical history, allergies, family history, immunizations, medications and social history were updated in the electronic medical record today. All recent labs, ED visits and hospitalizations within the last year were reviewed.  Health maintenance:  Mammogram: No Fhx. Last mammogram.  03/23/2023 bc-gso> patient desires every 18 months screening  Immunizations: tdap UTD 01/19/2016, influenza vac-declined PNA series completed 2018. Shingrix declined Infectious disease screening: hep c completed DEXA: osteoporosis. Vit d def. Dexa 09/2021 -2.5, rpt 2 years-ordered Patient has a Dental home. Hospitalizations/ED visits: Reviewed  Hypertension/obesity: Pt reports compliance with lisinopril 40, metoprolol 25 mg daily and amlodipine 10  Patient denies chest pain, shortness of breath, dizziness or lower extremity edema.  Pt does not take  daily baby ASA. Pt is not  prescribed statin. Diet: low sodium Exercise: routinely Labs UTD  RF: HTN, Fhx heart disease and stroke, Morbid obesity   Helping care for granddaughter, during her parents divorce. There is a lot of driving and no routine, which is causing stress. She declined referral or medication assistance  at this time.      01/11/2024    9:29 AM 04/27/2023    8:40 AM 10/12/2022    1:36 PM 09/22/2021    2:37 PM 09/16/2020    8:27 AM  Depression screen PHQ 2/9  Decreased Interest 0 0 0 0 0  Down, Depressed, Hopeless 1 0 0 0 0  PHQ - 2 Score 1 0 0 0 0  Altered sleeping 1       Tired, decreased energy 1      Change in appetite 0      Feeling bad or failure about yourself  0      Trouble concentrating 0      Moving slowly or fidgety/restless 0      Suicidal thoughts 0      PHQ-9 Score 3      Difficult doing work/chores Somewhat difficult          01/11/2024    9:29 AM  GAD 7 : Generalized Anxiety Score  Nervous, Anxious, on Edge 0  Control/stop worrying 1  Worry too much - different things 1  Trouble relaxing 0  Restless 0  Easily annoyed or irritable 1  Afraid - awful might happen 0  Total GAD 7 Score 3  Anxiety Difficulty Not difficult at all    Immunization History  Administered Date(s) Administered   Fluad Quad(high Dose 65+) 08/12/2019, 09/23/2020   Influenza, High Dose Seasonal PF 09/20/2018   Influenza-Unspecified 09/28/2021, 08/14/2022   Moderna Covid-19 Vaccine Bivalent Booster 42yrs & up 09/28/2021   Moderna Sars-Covid-2 Vaccination 12/19/2019, 01/17/2020, 09/26/2020   Pneumococcal Conjugate-13 01/19/2016   Pneumococcal Polysaccharide-23 09/19/2017   Tdap 01/19/2016   Past Medical History:  Diagnosis Date   Allergy    Anemia    Chicken pox    COVID    Depression    Hypertension    Menopause    Allergies  Allergen Reactions   Bactrim [Sulfamethoxazole-Trimethoprim]  Past Surgical History:  Procedure Laterality Date   SKIN BIOPSY     TUBAL LIGATION     Family History  Problem Relation Age of Onset   Colon cancer Father 13   Diabetes Father    Heart disease Father    Asthma Mother    Diabetes Mother    Heart disease Mother    Asthma Brother    Heart disease Brother    Liver cancer Maternal Grandmother    Heart disease Maternal Grandfather    Heart disease Paternal Grandmother    Stroke Paternal Grandfather    Social History   Social History Narrative   Divorced. 2 sons, 1 daughter. [name], [place]).   Some college. Retired. Moved from [place] to be next to family.    Drinks caffeine.    Wears her seatbelt  and bike helmet.    Smoke detector in the home.    Feels safe in her relationships.     Allergies as of 01/11/2024       Reactions   Bactrim [sulfamethoxazole-trimethoprim]         Medication List        Accurate as of January 11, 2024  9:44 AM. If you have any questions, ask your nurse or doctor.          amLODipine 10 MG tablet Commonly known as: NORVASC TAKE (1) TABLET BY MOUTH ONCE DAILY.   ascorbic acid 500 MG tablet Commonly known as: VITAMIN C Take 500 mg by mouth daily.   BIOTIN PO Take by mouth.   Fish Oil 1000 MG Cpdr Take by mouth.   furosemide 20 MG tablet Commonly known as: LASIX Take 0.5 tablets (10 mg total) by mouth daily as needed. Started by: Felix Pacini   lisinopril 40 MG tablet Commonly known as: ZESTRIL TAKE (1) TABLET BY MOUTH ONCE DAILY.   metoprolol succinate 25 MG 24 hr tablet Commonly known as: TOPROL-XL Take 1 tablet (25 mg total) by mouth daily.   Vitamin D (Cholecalciferol) 25 MCG (1000 UT) Caps Take 2 capsules by mouth daily. 2,000 units daily        All past medical history, surgical history, allergies, family history, immunizations andmedications were updated in the EMR today and reviewed under the history and medication portions of their EMR.     No results found for this or any previous visit (from the past 2160 hours).  DG Bone Density Result Date: 09/17/2021 ASSESSMENT: The BMD measured at Forearm Radius 33% is 0.666 g/cm2 with a T-score of -2.5. This patient is considered osteoporotic according to World Health Organization [facility]) criteria.  ROS 14 pt review of systems performed and negative (unless mentioned in an HPI)  Objective: BP 130/76   Pulse 78   Temp 98.3 F (36.8 C)   Ht 5\' 1"  (1.549 m)   Wt 208 lb 9.6 oz (94.6 kg)   SpO2 96%   BMI 39.41 kg/m  Physical Exam Vitals and nursing note reviewed.  Constitutional:      General: She is not in acute distress.    Appearance: Normal appearance. She is  obese. She is not ill-appearing or toxic-appearing.  HENT:     Head: Normocephalic and atraumatic.     Right Ear: Tympanic membrane, ear canal and external ear normal. There is no impacted cerumen.     Left Ear: Tympanic membrane, ear canal and external ear normal. There is no impacted cerumen.     Nose: No congestion or rhinorrhea.  Mouth/Throat:     Mouth: Mucous membranes are moist.     Pharynx: Oropharynx is clear. No oropharyngeal exudate or posterior oropharyngeal erythema.  Eyes:     General: No scleral icterus.       Right eye: No discharge.        Left eye: No discharge.     Extraocular Movements: Extraocular movements intact.     Conjunctiva/sclera: Conjunctivae normal.     Pupils: Pupils are equal, round, and reactive to light.  Cardiovascular:     Rate and Rhythm: Normal rate and regular rhythm.     Pulses: Normal pulses.     Heart sounds: Normal heart sounds. No murmur heard.    No friction rub. No gallop.  Pulmonary:     Effort: Pulmonary effort is normal. No respiratory distress.     Breath sounds: Normal breath sounds. No stridor. No wheezing, rhonchi or rales.  Chest:     Chest wall: No tenderness.  Abdominal:     General: Abdomen is flat. Bowel sounds are normal. There is no distension.     Palpations: Abdomen is soft. There is no mass.     Tenderness: There is no abdominal tenderness. There is no right CVA tenderness, left CVA tenderness, guarding or rebound.     Hernia: No hernia is present.  Musculoskeletal:        General: No swelling, tenderness or deformity. Normal range of motion.     Cervical back: Normal range of motion and neck supple. No rigidity or tenderness.     Right lower leg: No edema.     Left lower leg: No edema.  Lymphadenopathy:     Cervical: No cervical adenopathy.  Skin:    General: Skin is warm and dry.     Coloration: Skin is not jaundiced or pale.     Findings: No bruising, erythema, lesion or rash.  Neurological:     General:  No focal deficit present.     Mental Status: She is alert and oriented to person, place, and time. Mental status is at baseline.     Cranial Nerves: No cranial nerve deficit.     Sensory: No sensory deficit.     Motor: No weakness.     Coordination: Coordination normal.     Gait: Gait normal.     Deep Tendon Reflexes: Reflexes normal.  Psychiatric:        Mood and Affect: Mood normal.        Behavior: Behavior normal.        Thought Content: Thought content normal.        Judgment: Judgment normal.     No results found.  Assessment/plan: Meghan Vang is a 78 y.o. female present for CPE and Chronic Conditions/illness Management Essential hypertension, benign/ Obesity (BMI 30-39.9) Stable Continue lisinopril (ZESTRIL) 40 MG tablet Continue amLODipine (NORVASC) 10 MG tablet Continue metoprolol 25 mg daily  Osteoporosis without current pathological fracture, unspecified osteoporosis type/Vitamin D deficiency -- dexa UTD 2022-ordered today Continue vit d supplement.  Vitamin D collected today   Elevated A1c: Has been doing well, last A1c 5.9 diet controlled.  A1c collected today  Routine general medical examination at a health care facility Patient was encouraged to exercise greater than 150 minutes a week. Patient was encouraged to choose a diet filled with fresh fruits and vegetables, and lean meats. AVS provided to patient today for education/recommendation on gender specific health and safety maintenance. Mammogram: No Fhx. Last mammogram.  03/23/2023 bc-gso> patient  desires every 18 months screening  Immunizations: tdap UTD 01/19/2016, influenza vac-declined, PNA series completed 2018. Shingrix declined Infectious disease screening: hep c completed DEXA: osteoporosis. Vit d def. Dexa 09/2021 -2.5, rpt 2 years-ordered  Return in about 24 weeks (around 06/27/2024).  Orders Placed This Encounter  Procedures   DG Bone Density   MM 3D SCREENING MAMMOGRAM BILATERAL BREAST   CBC    Comprehensive metabolic panel   Hemoglobin A1c   Lipid panel   TSH   Vitamin D (25 hydroxy)   Meds ordered this encounter  Medications   amLODipine (NORVASC) 10 MG tablet    Sig: TAKE (1) TABLET BY MOUTH ONCE DAILY.    Dispense:  30 tablet    Refill:  0   lisinopril (ZESTRIL) 40 MG tablet    Sig: TAKE (1) TABLET BY MOUTH ONCE DAILY.    Dispense:  30 tablet    Refill:  0   metoprolol succinate (TOPROL-XL) 25 MG 24 hr tablet    Sig: Take 1 tablet (25 mg total) by mouth daily.    Dispense:  30 tablet    Refill:  0   furosemide (LASIX) 20 MG tablet    Sig: Take 0.5 tablets (10 mg total) by mouth daily as needed.    Dispense:  30 tablet    Refill:  3   Referral Orders  No referral(s) requested today     Electronically signed by: Felix Pacini, DO [facility] [place]

## 2024-01-11 NOTE — Patient Instructions (Addendum)
 Return in about 24 weeks (around 06/27/2024).        Great to see you today.  I have refilled the medication(s) we provide.   If labs were collected or images ordered, we will inform you of  results once we have received them and reviewed. We will contact you either by echart message, or telephone call.  Please give ample time to the testing facility, and our office to run,  receive and review results. Please do not call inquiring of results, even if you can see them in your chart. We will contact you as soon as we are able. If it has been over 1 week since the test was completed, and you have not yet heard from [place], then please call us.    - echart message- for normal results that have been seen by the patient already.   - telephone call: abnormal results or if patient has not viewed results in their echart.  If a referral to a specialist was entered for you, please call us in 2 weeks if you have not heard from the specialist office to schedule.

## 2024-01-12 ENCOUNTER — Encounter: Payer: Self-pay | Admitting: Family Medicine

## 2024-01-12 NOTE — Telephone Encounter (Signed)
 No further action needed.

## 2024-02-15 ENCOUNTER — Encounter: Payer: PPO | Admitting: Family Medicine

## 2024-02-21 ENCOUNTER — Other Ambulatory Visit: Payer: Self-pay | Admitting: Family Medicine

## 2024-03-25 ENCOUNTER — Other Ambulatory Visit: Payer: Self-pay | Admitting: Family Medicine

## 2024-04-17 ENCOUNTER — Other Ambulatory Visit: Payer: Self-pay | Admitting: Family Medicine

## 2024-04-22 ENCOUNTER — Other Ambulatory Visit: Payer: Self-pay | Admitting: Family Medicine

## 2024-05-20 ENCOUNTER — Other Ambulatory Visit: Payer: Self-pay | Admitting: Family Medicine

## 2024-06-17 ENCOUNTER — Other Ambulatory Visit: Payer: Self-pay | Admitting: Family Medicine

## 2024-06-20 ENCOUNTER — Other Ambulatory Visit: Payer: Self-pay | Admitting: Family Medicine

## 2024-06-21 ENCOUNTER — Other Ambulatory Visit: Payer: Self-pay | Admitting: Family Medicine

## 2024-06-24 ENCOUNTER — Other Ambulatory Visit: Payer: Self-pay

## 2024-06-24 ENCOUNTER — Other Ambulatory Visit: Payer: Self-pay | Admitting: Family Medicine

## 2024-06-24 MED ORDER — METOPROLOL SUCCINATE ER 25 MG PO TB24: 25.0000 mg | ORAL_TABLET | Freq: Every day | ORAL | 0 refills | Status: DC

## 2024-06-27 ENCOUNTER — Ambulatory Visit: Payer: PPO | Admitting: Family Medicine

## 2024-07-04 ENCOUNTER — Ambulatory Visit (INDEPENDENT_AMBULATORY_CARE_PROVIDER_SITE_OTHER): Admitting: Family Medicine

## 2024-07-04 ENCOUNTER — Encounter: Payer: Self-pay | Admitting: Family Medicine

## 2024-07-04 VITALS — BP 128/80 | HR 73 | Temp 97.9°F | Wt 211.6 lb

## 2024-07-04 DIAGNOSIS — R7309 Other abnormal glucose: Secondary | ICD-10-CM | POA: Diagnosis not present

## 2024-07-04 DIAGNOSIS — Z1231 Encounter for screening mammogram for malignant neoplasm of breast: Secondary | ICD-10-CM | POA: Diagnosis not present

## 2024-07-04 DIAGNOSIS — I10 Essential (primary) hypertension: Secondary | ICD-10-CM

## 2024-07-04 DIAGNOSIS — E669 Obesity, unspecified: Secondary | ICD-10-CM | POA: Diagnosis not present

## 2024-07-04 DIAGNOSIS — M810 Age-related osteoporosis without current pathological fracture: Secondary | ICD-10-CM | POA: Diagnosis not present

## 2024-07-04 DIAGNOSIS — E559 Vitamin D deficiency, unspecified: Secondary | ICD-10-CM

## 2024-07-04 LAB — POCT GLYCOSYLATED HEMOGLOBIN (HGB A1C)
HbA1c POC (<> result, manual entry): 5.5 % (ref 4.0–5.6)
HbA1c, POC (controlled diabetic range): 5.5 % (ref 0.0–7.0)
HbA1c, POC (prediabetic range): 5.5 % — AB (ref 5.7–6.4)
Hemoglobin A1C: 5.5 % (ref 4.0–5.6)

## 2024-07-04 MED ORDER — AMLODIPINE BESYLATE 10 MG PO TABS: ORAL_TABLET | ORAL | 1 refills | Status: AC

## 2024-07-04 MED ORDER — METOPROLOL SUCCINATE ER 25 MG PO TB24: 25.0000 mg | ORAL_TABLET | Freq: Every day | ORAL | 1 refills | Status: AC

## 2024-07-04 MED ORDER — FUROSEMIDE 20 MG PO TABS: 10.0000 mg | ORAL_TABLET | Freq: Every day | ORAL | 3 refills | Status: DC | PRN

## 2024-07-04 MED ORDER — LISINOPRIL 40 MG PO TABS: ORAL_TABLET | ORAL | 1 refills | Status: AC

## 2024-07-04 NOTE — Progress Notes (Signed)
 Patient ID: Meghan Vang, female  DOB: 12-Mar-1946, 78 y.o.   MRN: [id number] Patient Care Team    Relationship Specialty Notifications Start End  Catherine Charlies LABOR, DO PCP - General Family Medicine  01/19/16     Chief Complaint  Patient presents with   Hypertension    Pt is not fasting.     Subjective: Meghan Vang is a 78 y.o.  Female  present for chronic condition management appointment All past medical history, surgical history, allergies, family history, immunizations, medications and social history were updated in the electronic medical record today. All recent labs, ED visits and hospitalizations within the last year were reviewed.  Hypertension/obesity: Pt reports compliance with lisinopril  40, metoprolol  25 mg daily and amlodipine  10  Patient denies chest pain, shortness of breath, dizziness or lower extremity edema.  Pt does not take  daily baby ASA. Pt is not  prescribed statin. Diet: low sodium Exercise: routinely Labs UTD  RF: HTN, Fhx heart disease and stroke, Morbid obesity     07/04/2024   10:39 AM 01/11/2024    9:29 AM 04/27/2023    8:40 AM 10/12/2022    1:36 PM 09/22/2021    2:37 PM  Depression screen PHQ 2/9  Decreased Interest 1 0 0 0 0  Down, Depressed, Hopeless 1 1 0 0 0  PHQ - 2 Score 2 1 0 0 0  Altered sleeping 1 1     Tired, decreased energy 0 1     Change in appetite 0 0     Feeling bad or failure about yourself  0 0     Trouble concentrating 0 0     Moving slowly or fidgety/restless 0 0     Suicidal thoughts 0 0     PHQ-9 Score 3 3     Difficult doing work/chores Not difficult at all Somewhat difficult         07/04/2024   10:39 AM 01/11/2024    9:29 AM  GAD 7 : Generalized Anxiety Score  Nervous, Anxious, on Edge 0 0  Control/stop worrying 0 1  Worry too much - different things 1 1  Trouble relaxing 0 0  Restless 0 0  Easily annoyed or irritable 0 1  Afraid - awful might happen 0 0  Total GAD 7 Score 1 3  Anxiety Difficulty Not  difficult at all Not difficult at all    Immunization History  Administered Date(s) Administered   Fluad Quad(high Dose 65+) 08/12/2019, 09/23/2020   Influenza, High Dose Seasonal PF 09/20/2018   Influenza-Unspecified 09/28/2021, 08/14/2022   Moderna Covid-19 Vaccine Bivalent Booster 35yrs & up 09/28/2021   Moderna Sars-Covid-2 Vaccination 12/19/2019, 01/17/2020, 09/26/2020   Pneumococcal Conjugate-13 01/19/2016   Pneumococcal Polysaccharide-23 09/19/2017   Tdap 01/19/2016   Past Medical History:  Diagnosis Date   Allergy    Anemia    Chicken pox    COVID    Depression    Hypertension    Menopause    Allergies  Allergen Reactions   Bactrim [Sulfamethoxazole-Trimethoprim]    Past Surgical History:  Procedure Laterality Date   SKIN BIOPSY     TUBAL LIGATION     Family History  Problem Relation Age of Onset   Colon cancer Father 66   Diabetes Father    Heart disease Father    Asthma Mother    Diabetes Mother    Heart disease Mother    Asthma Brother    Heart disease Brother  Liver cancer Maternal Grandmother    Heart disease Maternal Grandfather    Heart disease Paternal Grandmother    Stroke Paternal Grandfather    Social History   Social History Narrative   Divorced. 2 sons, 1 daughter. Marny, Caron Kirsch).   Some college. Retired. Moved from [place] to be next to family.    Drinks caffeine.    Wears her seatbelt and bike helmet.    Smoke detector in the home.    Feels safe in her relationships.     Allergies as of 07/04/2024       Reactions   Bactrim [sulfamethoxazole-trimethoprim]         Medication List        Accurate as of July 04, 2024 10:58 AM. If you have any questions, ask your nurse or doctor.          amLODipine  10 MG tablet Commonly known as: NORVASC  TAKE (1) TABLET BY MOUTH ONCE DAILY.   ascorbic acid 500 MG tablet Commonly known as: VITAMIN C Take 500 mg by mouth daily.   BIOTIN PO Take by mouth.   Fish Oil 1000 MG  Cpdr Take by mouth.   furosemide  20 MG tablet Commonly known as: LASIX  Take 0.5 tablets (10 mg total) by mouth daily as needed.   lisinopril  40 MG tablet Commonly known as: ZESTRIL  TAKE (1) TABLET BY MOUTH ONCE DAILY.   metoprolol  succinate 25 MG 24 hr tablet Commonly known as: TOPROL -XL Take 1 tablet (25 mg total) by mouth daily.   Vitamin D  (Cholecalciferol) 25 MCG (1000 UT) Caps Take 2 capsules by mouth daily. 2,000 units daily        All past medical history, surgical history, allergies, family history, immunizations andmedications were updated in the EMR today and reviewed under the history and medication portions of their EMR.     Recent Results (from the past 2160 hours)  POCT HgB A1C     Status: Abnormal   Collection Time: 07/04/24 10:42 AM  Result Value Ref Range   Hemoglobin A1C 5.5 4.0 - 5.6 %   HbA1c POC (<> result, manual entry) 5.5 4.0 - 5.6 %   HbA1c, POC (prediabetic range) 5.5 (A) 5.7 - 6.4 %   HbA1c, POC (controlled diabetic range) 5.5 0.0 - 7.0 %    DG Bone Density Result Date: 09/17/2021 ASSESSMENT: The BMD measured at Forearm Radius 33% is 0.666 g/cm2 with a T-score of -2.5. This patient is considered osteoporotic according to World Health Organization [facility]) criteria.  ROS 14 pt review of systems performed and negative (unless mentioned in an HPI)  Objective: BP 128/80   Pulse 73   Temp 97.9 F (36.6 C)   Wt 211 lb 9.6 oz (96 kg)   SpO2 97%   BMI 39.98 kg/m  Physical Exam Vitals and nursing note reviewed.  Constitutional:      General: She is not in acute distress.    Appearance: Normal appearance. She is obese. She is not ill-appearing or toxic-appearing.  HENT:     Head: Normocephalic and atraumatic.     Right Ear: Tympanic membrane, ear canal and external ear normal. There is no impacted cerumen.     Left Ear: Tympanic membrane, ear canal and external ear normal. There is no impacted cerumen.     Nose: No congestion or rhinorrhea.      Mouth/Throat:     Mouth: Mucous membranes are moist.     Pharynx: Oropharynx is clear. No oropharyngeal exudate or  posterior oropharyngeal erythema.  Eyes:     General: No scleral icterus.       Right eye: No discharge.        Left eye: No discharge.     Extraocular Movements: Extraocular movements intact.     Conjunctiva/sclera: Conjunctivae normal.     Pupils: Pupils are equal, round, and reactive to light.  Cardiovascular:     Rate and Rhythm: Normal rate and regular rhythm.     Pulses: Normal pulses.     Heart sounds: Normal heart sounds. No murmur heard.    No friction rub. No gallop.  Pulmonary:     Effort: Pulmonary effort is normal. No respiratory distress.     Breath sounds: Normal breath sounds. No stridor. No wheezing, rhonchi or rales.  Chest:     Chest wall: No tenderness.  Abdominal:     General: Abdomen is flat. Bowel sounds are normal. There is no distension.     Palpations: Abdomen is soft. There is no mass.     Tenderness: There is no abdominal tenderness. There is no right CVA tenderness, left CVA tenderness, guarding or rebound.     Hernia: No hernia is present.  Musculoskeletal:        General: No swelling, tenderness or deformity. Normal range of motion.     Cervical back: Normal range of motion and neck supple. No rigidity or tenderness.     Right lower leg: No edema.     Left lower leg: No edema.  Lymphadenopathy:     Cervical: No cervical adenopathy.  Skin:    General: Skin is warm and dry.     Coloration: Skin is not jaundiced or pale.     Findings: No bruising, erythema, lesion or rash.  Neurological:     General: No focal deficit present.     Mental Status: She is alert and oriented to person, place, and time. Mental status is at baseline.     Cranial Nerves: No cranial nerve deficit.     Sensory: No sensory deficit.     Motor: No weakness.     Coordination: Coordination normal.     Gait: Gait normal.     Deep Tendon Reflexes: Reflexes normal.   Psychiatric:        Mood and Affect: Mood normal.        Behavior: Behavior normal.        Thought Content: Thought content normal.        Judgment: Judgment normal.     Results for orders placed or performed in visit on 07/04/24 (from the past 48 hours)  POCT HgB A1C     Status: Abnormal   Collection Time: 07/04/24 10:42 AM  Result Value Ref Range   Hemoglobin A1C 5.5 4.0 - 5.6 %   HbA1c POC (<> result, manual entry) 5.5 4.0 - 5.6 %   HbA1c, POC (prediabetic range) 5.5 (A) 5.7 - 6.4 %   HbA1c, POC (controlled diabetic range) 5.5 0.0 - 7.0 %     Assessment/plan: Meghan Vang is a 78 y.o. female present for Chronic Conditions/illness Management Essential hypertension, benign/ Obesity (BMI 30-39.9) Stable Continue lisinopril  (ZESTRIL ) 40 MG tablet continue amLODipine  (NORVASC ) 10 MG tablet Continue metoprolol  25 mg daily  Osteoporosis without current pathological fracture, unspecified osteoporosis type/Vitamin D  deficiency -- dexa UTD 2022-order active Continue vit d supplement.  Vitamin D  UTD   Elevated A1c: Has been doing well, A1c 5.9> 6.0>5.5 today diet controlled.    Return in  about 6 months (around 01/13/2025) for cpe (20 min), Routine chronic condition follow-up.  Orders Placed This Encounter  Procedures   MM 3D SCREENING MAMMOGRAM BILATERAL BREAST   POCT HgB A1C   Meds ordered this encounter  Medications   amLODipine  (NORVASC ) 10 MG tablet    Sig: TAKE (1) TABLET BY MOUTH ONCE DAILY.    Dispense:  90 tablet    Refill:  1   furosemide  (LASIX ) 20 MG tablet    Sig: Take 0.5 tablets (10 mg total) by mouth daily as needed.    Dispense:  30 tablet    Refill:  3   lisinopril  (ZESTRIL ) 40 MG tablet    Sig: TAKE (1) TABLET BY MOUTH ONCE DAILY.    Dispense:  90 tablet    Refill:  1    NA   metoprolol  succinate (TOPROL -XL) 25 MG 24 hr tablet    Sig: Take 1 tablet (25 mg total) by mouth daily.    Dispense:  90 tablet    Refill:  1    NA   Referral Orders   No referral(s) requested today     Electronically signed by: Charlies Bellini, DO [facility] [place]

## 2024-07-04 NOTE — Patient Instructions (Addendum)

## 2024-09-09 ENCOUNTER — Encounter

## 2024-09-11 ENCOUNTER — Ambulatory Visit

## 2024-10-02 ENCOUNTER — Ambulatory Visit (INDEPENDENT_AMBULATORY_CARE_PROVIDER_SITE_OTHER)

## 2024-10-02 VITALS — Ht 61.0 in | Wt 211.0 lb

## 2024-10-02 DIAGNOSIS — Z0000 Encounter for general adult medical examination without abnormal findings: Secondary | ICD-10-CM | POA: Diagnosis not present

## 2024-10-02 NOTE — Progress Notes (Signed)
 Chief Complaint  Patient presents with   Medicare Wellness     Subjective:   Meghan Vang is a 78 y.o. female who presents for a Medicare Annual Wellness Visit.  Allergies (verified) Bactrim [sulfamethoxazole-trimethoprim]   History: Past Medical History:  Diagnosis Date   Allergy    Anemia    Chicken pox    COVID    Depression    Hypertension    Menopause    Past Surgical History:  Procedure Laterality Date   SKIN BIOPSY     TUBAL LIGATION     Family History  Problem Relation Age of Onset   Colon cancer Father 13   Diabetes Father    Heart disease Father    Asthma Mother    Diabetes Mother    Heart disease Mother    Asthma Brother    Heart disease Brother    Liver cancer Maternal Grandmother    Heart disease Maternal Grandfather    Heart disease Paternal Grandmother    Stroke Paternal Grandfather    Social History   Occupational History   Occupation: RETIRED  Tobacco Use   Smoking status: Never   Smokeless tobacco: Never  Vaping Use   Vaping status: Never Used  Substance and Sexual Activity   Alcohol use: No    Alcohol/week: 0.0 standard drinks of alcohol   Drug use: No   Sexual activity: Never   Tobacco Counseling Counseling given: Not Answered  SDOH Screenings   Food Insecurity: No Food Insecurity (10/02/2024)  Housing: Low Risk  (10/02/2024)  Transportation Needs: No Transportation Needs (10/02/2024)  Utilities: Not At Risk (10/02/2024)  Alcohol Screen: Low Risk  (10/12/2022)  Depression (PHQ2-9): Low Risk  (10/02/2024)  Financial Resource Strain: Low Risk  (10/02/2024)  Physical Activity: Insufficiently Active (10/02/2024)  Social Connections: Socially Isolated (10/02/2024)  Stress: No Stress Concern Present (10/02/2024)  Tobacco Use: Low Risk  (10/02/2024)   See flowsheets for full screening details  Depression Screen PHQ 2 & 9 Depression Scale- Over the past 2 weeks, how often have you been bothered by any of the following  problems? Little interest or pleasure in doing things: 0 Feeling down, depressed, or hopeless (PHQ Adolescent also includes...irritable): 0 PHQ-2 Total Score: 0 Trouble falling or staying asleep, or sleeping too much: 0 Feeling tired or having little energy: 0 Poor appetite or overeating (PHQ Adolescent also includes...weight loss): 0 Feeling bad about yourself - or that you are a failure or have let yourself or your family down: 0 Trouble concentrating on things, such as reading the newspaper or watching television (PHQ Adolescent also includes...like school work): 0 Moving or speaking so slowly that other people could have noticed. Or the opposite - being so fidgety or restless that you have been moving around a lot more than usual: 0 Thoughts that you would be better off dead, or of hurting yourself in some way: 0 PHQ-9 Total Score: 0 If you checked off any problems, how difficult have these problems made it for you to do your work, take care of things at home, or get along with other people?: Not difficult at all  Depression Treatment Depression Interventions/Treatment : EYV7-0 Score <4 Follow-up Not Indicated     Goals Addressed             This Visit's Progress    Patient Stated   On track    Working on losing weight  Still working it/2025       Visit info /  Clinical Intake: Medicare Wellness Visit Type:: Subsequent Annual Wellness Visit Persons participating in visit:: patient Medicare Wellness Visit Mode:: Telephone If telephone:: video declined Because this visit was a virtual/telehealth visit:: vitals recorded from last visit If Telephone or Video please confirm:: I connected with the patient using audio enabled telemedicine application and verified that I am speaking with the correct person using two identifiers; I discussed the limitations of evaluation and management by telemedicine; The patient expressed understanding and agreed to proceed Patient Location::  Home Provider Location:: Home Information given by:: patient Interpreter Needed?: No Pre-visit prep was completed: yes AWV questionnaire completed by patient prior to visit?: yes Date:: 10/02/24 Living arrangements:: (!) lives alone Patient's Overall Health Status Rating: very good Typical amount of pain: none Does pain affect daily life?: no Are you currently prescribed opioids?: no  Dietary Habits and Nutritional Risks How many meals a day?: 3 Eats fruit and vegetables daily?: yes Most meals are obtained by: preparing own meals; eating out In the last 2 weeks, have you had any of the following?: none Diabetic:: no  Functional Status Activities of Daily Living (to include ambulation/medication): (Patient-Rptd) Independent Ambulation: (Patient-Rptd) Independent Medication Administration: Independent Home Management: (Patient-Rptd) Independent Manage your own finances?: yes Primary transportation is: driving Concerns about vision?: no *vision screening is required for WTM* Concerns about hearing?: no  Fall Screening Falls in the past year?: (Patient-Rptd) 0 Number of falls in past year: (Patient-Rptd) 0 Was there an injury with Fall?: 0 Fall Risk Category Calculator: 0 Patient Fall Risk Level: Low Fall Risk  Fall Risk Patient at Risk for Falls Due to: No Fall Risks Fall risk Follow up: Falls evaluation completed; Falls prevention discussed  Home and Transportation Safety: All rugs have non-skid backing?: N/A, no rugs All stairs or steps have railings?: yes (front and back) Grab bars in the bathtub or shower?: (!) no Have non-skid surface in bathtub or shower?: yes Good home lighting?: yes Regular seat belt use?: yes Hospital stays in the last year:: no  Cognitive Assessment Difficulty concentrating, remembering, or making decisions? : no Will 6CIT or Mini Cog be Completed: no 6CIT or Mini Cog Declined: patient alert, oriented, able to answer questions appropriately  and recall recent events  Advance Directives (For Healthcare) Does Patient Have a Medical Advance Directive?: No  Reviewed/Updated  Reviewed/Updated: Reviewed All (Medical, Surgical, Family, Medications, Allergies, Care Teams, Patient Goals)        Objective:    Today's Vitals   10/02/24 0847  Weight: 211 lb (95.7 kg)  Height: 5' 1 (1.549 m)   Body mass index is 39.87 kg/m.  Current Medications (verified) Outpatient Encounter Medications as of 10/02/2024  Medication Sig   amLODipine  (NORVASC ) 10 MG tablet TAKE (1) TABLET BY MOUTH ONCE DAILY.   lisinopril  (ZESTRIL ) 40 MG tablet TAKE (1) TABLET BY MOUTH ONCE DAILY.   metoprolol  succinate (TOPROL -XL) 25 MG 24 hr tablet Take 1 tablet (25 mg total) by mouth daily.   Omega-3 Fatty Acids (FISH OIL) 1000 MG CPDR Take by mouth.   vitamin C (ASCORBIC ACID) 500 MG tablet Take 500 mg by mouth daily.   Vitamin D , Cholecalciferol, 25 MCG (1000 UT) CAPS Take 2 capsules by mouth daily. 2,000 units daily   BIOTIN PO Take by mouth.   [DISCONTINUED] furosemide  (LASIX ) 20 MG tablet Take 0.5 tablets (10 mg total) by mouth daily as needed.   No facility-administered encounter medications on file as of 10/02/2024.   Hearing/Vision screen Hearing Screening - Comments::  Denies hearing difficulties   Vision Screening - Comments:: Denies vision issues./Not UTD/no provider  Immunizations and Health Maintenance Health Maintenance  Topic Date Due   DEXA SCAN  09/18/2023   Medicare Annual Wellness (AWV)  10/13/2023   Mammogram  04/11/2024   Influenza Vaccine  02/11/2025 (Originally 06/14/2024)   DTaP/Tdap/Td (2 - Td or Tdap) 01/18/2026   Pneumococcal Vaccine: 50+ Years  Completed   Hepatitis C Screening  Completed   Meningococcal B Vaccine  Aged Out   COVID-19 Vaccine  Discontinued   Fecal DNA (Cologuard)  Discontinued   Zoster Vaccines- Shingrix   Discontinued        Assessment/Plan:  This is a routine wellness examination for  Meghan Vang.  Patient Care Team: Catherine Charlies LABOR, DO as PCP - General (Family Medicine)  I have personally reviewed and noted the following in the patient's chart:   Medical and social history Use of alcohol, tobacco or illicit drugs  Current medications and supplements including opioid prescriptions. Functional ability and status Nutritional status Physical activity Advanced directives List of other physicians Hospitalizations, surgeries, and ER visits in previous 12 months Vitals Screenings to include cognitive, depression, and falls Referrals and appointments  No orders of the defined types were placed in this encounter.  In addition, I have reviewed and discussed with patient certain preventive protocols, quality metrics, and best practice recommendations. A written personalized care plan for preventive services as well as general preventive health recommendations were provided to patient.   Meghan Vang Allyna Pittsley, CMA   10/02/2024   No follow-ups on file.  After Visit Summary: (MyChart) Due to this being a telephonic visit, the after visit summary with patients personalized plan was offered to patient via MyChart   Nurse Notes: Patient is due for a flu vaccine.  She is also due for a mammogram and a DEXA, which orders have been placed by provider.  Patient is aware where to call and get scheduled.  She had no concerns to address today.

## 2024-10-02 NOTE — Patient Instructions (Addendum)
 Ms. Dake,  Thank you for taking the time for your Medicare Wellness Visit. I appreciate your continued commitment to your health goals. Please review the care plan we discussed, and feel free to reach out if I can assist you further.  Please note that Annual Wellness Visits do not include a physical exam. Some assessments may be limited, especially if the visit was conducted virtually. If needed, we may recommend an in-person follow-up with your provider.  Ongoing Care Seeing your primary care provider every 3 to 6 months helps us  monitor your health and provide consistent, personalized care. Your next office visit is on 01/07/2025.  You are due for a flu vaccine.  Remember to get scheduled for your bone density screening soon.    Mobile Mammogram [facility] 2026 in your area will be on  January 6 - [facility] - 8:30 - 4:30 -  [address] [place]  Referrals If a referral was made during today's visit and you haven't received any updates within two weeks, please contact the referred provider directly to check on the status.  Recommended Screenings:  Health Maintenance  Topic Date Due   DEXA scan (bone density measurement)  09/18/2023   Breast Cancer Screening  04/11/2024   Flu Shot  02/11/2025*   Medicare Annual Wellness Visit  10/02/2025   DTaP/Tdap/Td vaccine (2 - Td or Tdap) 01/18/2026   Pneumococcal Vaccine for age over 76  Completed   Hepatitis C Screening  Completed   Meningitis B Vaccine  Aged Out   COVID-19 Vaccine  Discontinued   Cologuard (Stool DNA test)  Discontinued   Zoster (Shingles) Vaccine  Discontinued  *Topic was postponed. The date shown is not the original due date.       10/02/2024    6:43 AM  Advanced Directives  Does Patient Have a Medical Advance Directive? No    Vision: Annual vision screenings are recommended for early detection of glaucoma, cataracts, and diabetic retinopathy. These exams can also reveal signs of chronic conditions such as diabetes  and high blood pressure.  Dental: Annual dental screenings help detect early signs of oral cancer, gum disease, and other conditions linked to overall health, including heart disease and diabetes.  Please see the attached documents for additional preventive care recommendations.

## 2025-01-07 ENCOUNTER — Encounter: Admitting: Family Medicine

## 2025-01-31 ENCOUNTER — Encounter: Admitting: Family Medicine
